# Patient Record
Sex: Male | Born: 1955 | Race: White | Hispanic: No | Marital: Single | Smoking: Current every day smoker
Health system: Southern US, Community
[De-identification: ages and names within clinical notes are randomized; demographics above are authoritative.]

## PROBLEM LIST (undated history)

## (undated) DIAGNOSIS — T7840XA Allergy, unspecified, initial encounter: Secondary | ICD-10-CM

## (undated) DIAGNOSIS — G4730 Sleep apnea, unspecified: Secondary | ICD-10-CM

## (undated) DIAGNOSIS — M1990 Unspecified osteoarthritis, unspecified site: Secondary | ICD-10-CM

## (undated) DIAGNOSIS — E785 Hyperlipidemia, unspecified: Secondary | ICD-10-CM

## (undated) DIAGNOSIS — R569 Unspecified convulsions: Secondary | ICD-10-CM

## (undated) DIAGNOSIS — N189 Chronic kidney disease, unspecified: Secondary | ICD-10-CM

## (undated) DIAGNOSIS — F209 Schizophrenia, unspecified: Secondary | ICD-10-CM

## (undated) DIAGNOSIS — I10 Essential (primary) hypertension: Secondary | ICD-10-CM

## (undated) HISTORY — DX: Unspecified convulsions: R56.9

## (undated) HISTORY — DX: Hyperlipidemia, unspecified: E78.5

## (undated) HISTORY — PX: TONSILLECTOMY: SUR1361

## (undated) HISTORY — DX: Essential (primary) hypertension: I10

## (undated) HISTORY — DX: Sleep apnea, unspecified: G47.30

## (undated) HISTORY — DX: Allergy, unspecified, initial encounter: T78.40XA

## (undated) HISTORY — DX: Schizophrenia, unspecified: F20.9

## (undated) HISTORY — PX: FACIAL FRACTURE SURGERY: SHX1570

## (undated) HISTORY — DX: Unspecified osteoarthritis, unspecified site: M19.90

## (undated) HISTORY — PX: COLONOSCOPY: SHX174

## (undated) HISTORY — DX: Chronic kidney disease, unspecified: N18.9

---

## 2003-06-21 ENCOUNTER — Ambulatory Visit (HOSPITAL_BASED_OUTPATIENT_CLINIC_OR_DEPARTMENT_OTHER): Admission: RE | Admit: 2003-06-21 | Discharge: 2003-06-21 | Payer: Self-pay | Admitting: Family Medicine

## 2003-06-21 ENCOUNTER — Encounter: Payer: Self-pay | Admitting: Pulmonary Disease

## 2004-02-14 ENCOUNTER — Ambulatory Visit: Payer: Self-pay | Admitting: Pulmonary Disease

## 2004-10-06 ENCOUNTER — Ambulatory Visit: Payer: Self-pay | Admitting: Pulmonary Disease

## 2005-08-16 ENCOUNTER — Ambulatory Visit: Payer: Self-pay | Admitting: Pulmonary Disease

## 2005-11-03 ENCOUNTER — Ambulatory Visit: Payer: Self-pay | Admitting: Family Medicine

## 2005-12-02 ENCOUNTER — Ambulatory Visit: Payer: Self-pay | Admitting: Family Medicine

## 2006-01-21 ENCOUNTER — Encounter: Payer: Self-pay | Admitting: Family Medicine

## 2006-01-28 ENCOUNTER — Ambulatory Visit: Payer: Self-pay | Admitting: Family Medicine

## 2006-02-11 ENCOUNTER — Ambulatory Visit: Payer: Self-pay | Admitting: Family Medicine

## 2006-04-07 ENCOUNTER — Ambulatory Visit: Payer: Self-pay | Admitting: Pulmonary Disease

## 2006-06-21 DIAGNOSIS — F209 Schizophrenia, unspecified: Secondary | ICD-10-CM | POA: Insufficient documentation

## 2006-06-22 DIAGNOSIS — G2581 Restless legs syndrome: Secondary | ICD-10-CM | POA: Insufficient documentation

## 2006-06-22 DIAGNOSIS — J309 Allergic rhinitis, unspecified: Secondary | ICD-10-CM | POA: Insufficient documentation

## 2006-06-22 DIAGNOSIS — I10 Essential (primary) hypertension: Secondary | ICD-10-CM

## 2007-03-13 DIAGNOSIS — G4733 Obstructive sleep apnea (adult) (pediatric): Secondary | ICD-10-CM

## 2007-03-14 ENCOUNTER — Ambulatory Visit: Payer: Self-pay | Admitting: Pulmonary Disease

## 2007-03-27 ENCOUNTER — Ambulatory Visit: Payer: Self-pay | Admitting: Family Medicine

## 2007-04-05 ENCOUNTER — Ambulatory Visit: Payer: Self-pay | Admitting: Family Medicine

## 2007-04-05 DIAGNOSIS — E7800 Pure hypercholesterolemia, unspecified: Secondary | ICD-10-CM

## 2007-04-07 ENCOUNTER — Ambulatory Visit: Payer: Self-pay | Admitting: Gastroenterology

## 2007-04-12 ENCOUNTER — Encounter (INDEPENDENT_AMBULATORY_CARE_PROVIDER_SITE_OTHER): Payer: Self-pay | Admitting: *Deleted

## 2007-04-12 LAB — CONVERTED CEMR LAB
AST: 19 units/L (ref 0–37)
Albumin: 4.2 g/dL (ref 3.5–5.2)
Alkaline Phosphatase: 82 units/L (ref 39–117)
CO2: 29 meq/L (ref 19–32)
Chloride: 98 meq/L (ref 96–112)
Cholesterol: 211 mg/dL (ref 0–200)
Creatinine, Ser: 1 mg/dL (ref 0.4–1.5)
PSA: 0.57 ng/mL (ref 0.10–4.00)
Sodium: 135 meq/L (ref 135–145)
Total Bilirubin: 0.8 mg/dL (ref 0.3–1.2)
Total Protein: 7.1 g/dL (ref 6.0–8.3)
VLDL: 15 mg/dL (ref 0–40)

## 2007-04-21 ENCOUNTER — Encounter: Payer: Self-pay | Admitting: Gastroenterology

## 2007-04-21 ENCOUNTER — Encounter: Payer: Self-pay | Admitting: Family Medicine

## 2007-04-21 ENCOUNTER — Ambulatory Visit: Payer: Self-pay | Admitting: Gastroenterology

## 2007-05-03 ENCOUNTER — Ambulatory Visit: Payer: Self-pay | Admitting: Family Medicine

## 2007-05-03 DIAGNOSIS — R21 Rash and other nonspecific skin eruption: Secondary | ICD-10-CM

## 2007-07-12 ENCOUNTER — Ambulatory Visit: Payer: Self-pay | Admitting: Family Medicine

## 2007-07-20 LAB — CONVERTED CEMR LAB
HDL: 37.6 mg/dL — ABNORMAL LOW (ref >39.0)
Total CHOL/HDL Ratio: 3.2
VLDL: 11 mg/dL (ref 0–40)

## 2008-03-13 ENCOUNTER — Ambulatory Visit: Payer: Self-pay | Admitting: Pulmonary Disease

## 2008-05-14 ENCOUNTER — Ambulatory Visit: Payer: Self-pay | Admitting: Family Medicine

## 2008-05-14 DIAGNOSIS — F17200 Nicotine dependence, unspecified, uncomplicated: Secondary | ICD-10-CM

## 2008-05-14 DIAGNOSIS — Z720 Tobacco use: Secondary | ICD-10-CM | POA: Insufficient documentation

## 2008-08-29 ENCOUNTER — Ambulatory Visit: Payer: Self-pay | Admitting: Family Medicine

## 2008-08-29 DIAGNOSIS — R634 Abnormal weight loss: Secondary | ICD-10-CM | POA: Insufficient documentation

## 2009-04-09 ENCOUNTER — Ambulatory Visit: Payer: Self-pay | Admitting: Pulmonary Disease

## 2009-05-12 ENCOUNTER — Telehealth: Payer: Self-pay | Admitting: Pulmonary Disease

## 2009-05-27 ENCOUNTER — Ambulatory Visit: Payer: Self-pay | Admitting: Family Medicine

## 2009-06-11 ENCOUNTER — Ambulatory Visit: Payer: Self-pay | Admitting: Family Medicine

## 2009-06-12 LAB — CONVERTED CEMR LAB
ALT: 21 units/L (ref 0–53)
Albumin: 4.2 g/dL (ref 3.5–5.2)
BUN: 5 mg/dL — ABNORMAL LOW (ref 6–23)
CO2: 31 meq/L (ref 19–32)
Chloride: 101 meq/L (ref 96–112)
Cholesterol: 126 mg/dL (ref 0–200)
Creatinine, Ser: 0.9 mg/dL (ref 0.4–1.5)
Direct LDL: 72.7 mg/dL
Eosinophils Relative: 5.4 % — ABNORMAL HIGH (ref 0.0–5.0)
Lymphocytes Relative: 31.1 % (ref 12.0–46.0)
Monocytes Absolute: 0.6 10*3/uL (ref 0.1–1.0)
Monocytes Relative: 9.7 % (ref 3.0–12.0)
Neutrophils Relative %: 52.5 % (ref 43.0–77.0)
Platelets: 223 10*3/uL (ref 150.0–400.0)
TSH: 1.99 microintl units/mL (ref 0.35–5.50)
Total Protein: 7 g/dL (ref 6.0–8.3)
WBC: 6.1 10*3/uL (ref 4.5–10.5)

## 2009-06-17 ENCOUNTER — Ambulatory Visit: Payer: Self-pay | Admitting: Family Medicine

## 2009-06-17 LAB — CONVERTED CEMR LAB: PSA: 0.62 ng/mL (ref 0.10–4.00)

## 2009-07-14 ENCOUNTER — Telehealth (INDEPENDENT_AMBULATORY_CARE_PROVIDER_SITE_OTHER): Payer: Self-pay | Admitting: *Deleted

## 2009-07-22 ENCOUNTER — Telehealth: Payer: Self-pay | Admitting: Gastroenterology

## 2009-07-22 ENCOUNTER — Encounter (INDEPENDENT_AMBULATORY_CARE_PROVIDER_SITE_OTHER): Payer: Self-pay | Admitting: *Deleted

## 2009-07-28 ENCOUNTER — Telehealth (INDEPENDENT_AMBULATORY_CARE_PROVIDER_SITE_OTHER): Payer: Self-pay | Admitting: *Deleted

## 2009-08-13 ENCOUNTER — Telehealth (INDEPENDENT_AMBULATORY_CARE_PROVIDER_SITE_OTHER): Payer: Self-pay | Admitting: *Deleted

## 2009-08-26 ENCOUNTER — Encounter (INDEPENDENT_AMBULATORY_CARE_PROVIDER_SITE_OTHER): Payer: Self-pay | Admitting: *Deleted

## 2009-08-29 ENCOUNTER — Ambulatory Visit: Payer: Self-pay | Admitting: Gastroenterology

## 2009-09-02 ENCOUNTER — Ambulatory Visit: Payer: Self-pay | Admitting: Gastroenterology

## 2009-09-03 ENCOUNTER — Encounter: Payer: Self-pay | Admitting: Gastroenterology

## 2009-09-12 ENCOUNTER — Ambulatory Visit: Payer: Self-pay | Admitting: Family Medicine

## 2009-09-12 LAB — CONVERTED CEMR LAB
HDL: 42.2 mg/dL (ref >39.00)
Triglycerides: 174 mg/dL — ABNORMAL HIGH (ref 0.0–149.0)

## 2009-09-17 ENCOUNTER — Ambulatory Visit: Payer: Self-pay | Admitting: Family Medicine

## 2009-09-17 DIAGNOSIS — J018 Other acute sinusitis: Secondary | ICD-10-CM

## 2009-12-24 ENCOUNTER — Ambulatory Visit: Payer: Self-pay | Admitting: Family Medicine

## 2010-04-08 ENCOUNTER — Ambulatory Visit
Admission: RE | Admit: 2010-04-08 | Discharge: 2010-04-08 | Payer: Self-pay | Source: Home / Self Care | Attending: Pulmonary Disease | Admitting: Pulmonary Disease

## 2010-04-30 NOTE — Assessment & Plan Note (Signed)
Summary: COMPLAINING OF HIGH BLOOD PRESSURE   Vital Signs:  Patient profile:   55 year old male Height:      68 inches Weight:      221.6 pounds BMI:     33.82 Temp:     98.1 degrees F oral Pulse rate:   72 / minute Pulse rhythm:   regular BP sitting:   140 / 80  (left arm) Cuff size:   regular  Vitals Entered By: Benny Lennert CMA Duncan Dull) (May 27, 2009 4:06 PM)  History of Present Illness: Chief complaint htn   HTN, poor control. Elevated at dentist recently.  Not checking BP at home. 6 lb weight loss in past few months. Limited exercsie. Still smoking..1/2 pack a day..did not tolerate Chantix.  Preventive Screening-Counseling & Management  Alcohol-Tobacco     Smoking Cessation Counseling: YES     Smoke Cessation Stage: precontemplative     Packs/Day: 0.5     Tobacco Counseling: to quit use of tobacco products  Problems Prior to Update: 1)  Weight Loss  (ICD-783.21) 2)  Tobacco Abuse  (ICD-305.1) 3)  Restless Legs Syndrome  (ICD-333.94) 4)  Skin Rash  (ICD-782.1) 5)  Pure Hypercholesterolemia  (ICD-272.0) 6)  Special Screening Malignant Neoplasm of Prostate  (ICD-V76.44) 7)  Preventive Health Care  (ICD-V70.0) 8)  Obstructive Sleep Apnea  (ICD-327.23) 9)  Hypertension  (ICD-401.9) 10)  Allergic Rhinitis  (ICD-477.9) 11)  Hx of Restless Leg Syndrome  (ICD-333.94) 12)  Schizophrenia  (ICD-295.90)  Current Medications (verified): 1)  Requip 1 Mg Tabs (Ropinirole Hcl) .... Take 1 Tablet By Mouth At Bedtime 2)  Claritin 10 Mg Tabs (Loratadine) .... Take 1 Tablet By Mouth Once A Day 3)  Artane 5mg  .... Take 1 Tablet By Mouth Two Times A Day 4)  Prolixin 10mg  .... Take 1 Tablet By Mouth Once A Day 5)  Lisinopril-Hydrochlorothiazide 10-12.5 Mg Tabs (Lisinopril-Hydrochlorothiazide) .Marland Kitchen.. 1 Tab By Mouth Daily 6)  Simvastatin 40 Mg  Tabs (Simvastatin) .... Take 1 Tablet By Mouth Once A Day  Allergies: 1)  ! Chantix Continuing Month Pak (Varenicline Tartrate)  Past  History:  Past medical, surgical, family and social histories (including risk factors) reviewed, and no changes noted (except as noted below).  Past Medical History: Reviewed history from 06/21/2006 and no changes required. Allergic rhinitis Hypertension  Past Surgical History: Reviewed history from 06/21/2006 and no changes required. Tonsillectomy  Family History: Reviewed history from 03/27/2007 and no changes required. mother healthy father CVA, died of gallbladder disease and complications  Social History: Reviewed history from 03/27/2007 and no changes required. 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 CV:  Denies chest pain or discomfort and swelling of feet. Resp:  Denies shortness of breath, sputum productive, and wheezing. GI:  Denies abdominal pain. GU:  Denies dysuria.  Physical Exam  General:  overweight  male in nad Mouth:  Oral mucosa and oropharynx without lesions or exudates.  Teeth in poor repair...smells of smoke Neck:  no carotid bruit or thyromegaly no cervical or supraclavicular lymphadenopathy  Lungs:  Normal respiratory effort, chest expands symmetrically. Lungs are clear to auscultation, no crackles or wheezes. Heart:  Normal rate and regular rhythm. S1 and S2 normal without gallop, murmur, click, rub or other extra sounds. Abdomen:  Bowel sounds positive,abdomen soft and non-tender without masses, organomegaly or hernias noted. Pulses:  R and L posterior tibial pulses are full and  equal bilaterally  Extremities:  no edema    Impression & Recommendations:  Problem # 1:  HYPERTENSION (ICD-401.9) Poor control. Start lisinopril/HCTZ daily, check Cr in 7-10 days. Counseled on smoking cessation.Teryl Lucy precontemplative (did not tolerate chantix in past) His updated medication list for this problem includes:    Lisinopril-hydrochlorothiazide  10-12.5 Mg Tabs (Lisinopril-hydrochlorothiazide) .Marland Kitchen... 1 tab by mouth daily  Complete Medication List: 1)  Requip 1 Mg Tabs (Ropinirole hcl) .... Take 1 tablet by mouth at bedtime 2)  Claritin 10 Mg Tabs (Loratadine) .... Take 1 tablet by mouth once a day 3)  Artane 5mg   .... Take 1 tablet by mouth two times a day 4)  Prolixin 10mg   .... Take 1 tablet by mouth once a day 5)  Lisinopril-hydrochlorothiazide 10-12.5 Mg Tabs (Lisinopril-hydrochlorothiazide) .Marland Kitchen.. 1 tab by mouth daily 6)  Simvastatin 40 Mg Tabs (Simvastatin) .... Take 1 tablet by mouth once a day  Patient Instructions: 1)  Fasting lipids, CMET, TSH, cbc Dx 401.1 prior to next appt.  2)  Scheduled CPX in next  2-3 weeks.  3)  Start lisinopril/HCTZ daily  4)  Tobacco is very bad for your health and your loved ones ! You should stop smoking !  5)  Stop smoking tips: Choose a quit date. Cut down before the quit date. Decide what you will do as a substitute when you feel the urge to smoke(gum, toothpick, exercise).  Prescriptions: LISINOPRIL-HYDROCHLOROTHIAZIDE 10-12.5 MG TABS (LISINOPRIL-HYDROCHLOROTHIAZIDE) 1 tab by mouth daily  #30 x 11   Entered and Authorized by:   Kerby Nora MD   Signed by:   Kerby Nora MD on 05/27/2009   Method used:   [facility]        [facility]  [address] (retail)       [address]       [place], [place]  [address]       Ph: [phone] or [phone]       Fax: [phone]   RxID:   [id number]   Current Allergies (reviewed today): ! CHANTIX CONTINUING MONTH PAK (VARENICLINE TARTRATE)

## 2010-04-30 NOTE — Letter (Signed)
Summary: Previsit letter  [facility]  [address] [place], [place] [address]   Phone: [phone]  Fax: [phone]       07/22/2009 MRN: [id number]  Carl Contreras [address] [place]  [address]  Dear Carl Contreras,  Welcome to the Gastroenterology Division at [facility].    You are scheduled to see a nurse for your pre-procedure visit on 08/29/09 at 1:00pm on the 3rd floor at [facility], [address]  We ask that you try to arrive at our office 15 minutes prior to your appointment time to allow for check-in.  Your nurse visit will consist of discussing your medical and surgical history, your immediate family medical history, and your medications.    Please bring a complete list of all your medications or, if you prefer, bring the medication bottles and we will list them.  We will need to be aware of both prescribed and over the counter drugs.  We will need to know exact dosage information as well.  If you are on blood thinners (Coumadin, Plavix, Aggrenox, Ticlid, etc.) please call our office today/prior to your appointment, as we need to consult with your physician about holding your medication.   Please be prepared to read and sign documents such as consent forms, a financial agreement, and acknowledgement forms.  If necessary, and with your consent, a friend or relative is welcome to sit-in on the nurse visit with you.  Please bring your insurance card so that we may make a copy of it.  If your insurance requires a referral to see a specialist, please bring your referral form from your primary care physician.  No co-pay is required for this nurse visit.     If you cannot keep your appointment, please call [phone] to cancel or reschedule prior to your appointment date.  This allows [place] the opportunity to schedule an appointment for another patient in need of care.    Thank you for choosing [facility] for your  medical needs.  We appreciate the opportunity to care for you.  Please visit [place] at our website  to learn more about our practice.                     Sincerely.                                                                                                                   The Gastroenterology Division

## 2010-04-30 NOTE — Assessment & Plan Note (Signed)
Summary: BAD HEAD COLD OR SEVERE SINUS INFECTION   Vital Signs:  Patient profile:   55 year old male Height:      68 inches Weight:      225 pounds BMI:     34.33 Temp:     98.8 degrees F oral Pulse rate:   76 / minute Pulse rhythm:   regular BP sitting:   138 / 80  (left arm) Cuff size:   regular  Vitals Entered By: Linde Gillis CMA Duncan Dull) (September 17, 2009 3:11 PM) CC: ? sinus infection/cold   History of Present Illness: 55 yo here for over 1 week of URI symptoms.  Started with runny nose and sneezing. Over last 3 days, having sinus pressure, frontal headache, feels like he has a fever. No cough, sore throat, shortness of breath or wheezing.   Current Medications (verified): 1)  Requip 1 Mg Tabs (Ropinirole Hcl) .... Take 1 Tablet By Mouth At Bedtime 2)  Claritin 10 Mg Tabs (Loratadine) .... Take 1 Tablet By Mouth Once A Day 3)  Artane 5mg  .... Take 1 Tablet By Mouth Two Times A Day 4)  Prolixin 10mg  .... Take 1 Tablet By Mouth Once A Day 5)  Lisinopril-Hydrochlorothiazide 10-12.5 Mg Tabs (Lisinopril-Hydrochlorothiazide) .Marland Kitchen.. 1 Tab By Mouth Daily 6)  Simvastatin 40 Mg  Tabs (Simvastatin) .... Take 1 Tablet By Mouth Once A Day 7)  Fish Oil 1000 Mg Caps (Omega-3 Fatty Acids) .... Take 2 Tablet 2 Times Daily 8)  Amoxicillin 500 Mg Tabs (Amoxicillin) .Marland Kitchen.. 1 Tab By Mouth Two Times A Day X 10 Days  Allergies: 1)  ! 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 ENT:  Complains of nasal congestion, postnasal drainage, and sinus pressure. CV:  Denies chest pain or discomfort.  Physical Exam  General:  Well-developed,well-nourished,in no acute distress; alert,appropriate and cooperative throughout examination Ears:  TMs retracted bilaterally Nose:  mucosal erythema, thick nasal discharge sinuses ttp throughout Mouth:  pharyngeal erythema.   no exudate Lungs:  Normal respiratory effort, chest expands symmetrically. Lungs are clear to auscultation, no crackles or wheezes. Heart:  Normal rate and regular rhythm. S1 and S2 normal without gallop, murmur, click, rub or other extra sounds. Psych:  Cognition and judgment appear intact. Alert and cooperative with normal attention span and concentration. No apparent delusions, illusions, hallucinations   Impression & Recommendations:  Problem # 1:  OTHER ACUTE SINUSITIS (ICD-461.8) Assessment New Given duration and progression of symptoms, will treat for bacterial sinusitis with amoxicillin x 10days. Supportive care as per pt instructions. His updated medication list for this problem includes:    Amoxicillin 500 Mg Tabs (Amoxicillin) .Marland Kitchen... 1 tab by mouth two times a day x 10 days  Complete Medication List: 1)  Requip 1 Mg Tabs (Ropinirole hcl) .... Take 1 tablet by mouth at bedtime 2)  Claritin 10 Mg Tabs (Loratadine) .... Take 1 tablet by mouth once a day 3)  Artane 5mg   .... Take 1  tablet by mouth two times a day 4)  Prolixin 10mg   .... Take 1 tablet by mouth once a day 5)  Lisinopril-hydrochlorothiazide 10-12.5 Mg Tabs (Lisinopril-hydrochlorothiazide) .Marland Kitchen.. 1 tab by mouth daily 6)  Simvastatin 40 Mg Tabs (Simvastatin) .... Take 1 tablet by mouth once a day 7)  Fish Oil 1000 Mg Caps (Omega-3 fatty acids) .... Take 2 tablet 2 times daily 8)  Amoxicillin 500  Mg Tabs (Amoxicillin) .Marland Kitchen.. 1 tab by mouth two times a day x 10 days  Patient Instructions: 1)  Take antibiotic as directed.  Drink lots of fluids.  Treat sympotmatically with Mucinex, nasal saline irrigation, and Tylenol/Ibuprofen. Also try claritin D or zyrtec D over the counter- two times a day as needed ( have to sign for them at pharmacy). You can use warm compresses.  Cough suppressant at night. Call if not improving as expected in 5-7 days.  Prescriptions: AMOXICILLIN 500 MG TABS (AMOXICILLIN) 1 tab by mouth two times a day x 10 days  #20 x 0   Entered and Authorized by:   Ruthe Mannan MD   Signed by:   Ruthe Mannan MD on 09/17/2009   Method used:   Print then Give to Patient   RxID:   [id number]   Current Allergies (reviewed today): ! CHANTIX CONTINUING MONTH PAK (VARENICLINE TARTRATE)

## 2010-04-30 NOTE — Miscellaneous (Signed)
Summary: Health Care Power of [facility] Power of Attorney   Imported By: Lenard Forth 03/20/2010 13:34:19  _____________________________________________________________________  External Attachment:    Type:   Image     Comment:   External Document

## 2010-04-30 NOTE — Progress Notes (Signed)
Summary: requip refill  Phone Note Call from Patient Call back at Home Phone [phone]   Caller: Patient Call For: clance Reason for Call: Refill Medication Summary of Call: Patient requesting refill--requip 1 mg---[facility] Initial call taken by: Lehman Prom,  Aug 13, 2009 12:16 PM  Follow-up for Phone Call        Rx was sent to [facility] per pt request.  [facility] for pt to be made aware. Follow-up by: Vernie Murders,  Aug 13, 2009 1:42 PM    Prescriptions: REQUIP 1 MG TABS (ROPINIROLE HCL) Take 1 tablet by mouth at bedtime  #90 x 3   Entered by:   Vernie Murders   Authorized by:   Barbaraann Share MD   Signed by:   Vernie Murders on 08/13/2009   Method used:   Electronically to        [facility] (mail-order)             ,          Ph: [phone]       Fax: [phone]   RxID:   [id number]

## 2010-04-30 NOTE — Assessment & Plan Note (Signed)
Summary: rov for osa/rls   CC:  Pt is here for a 1 yr f/u appt.  pt was last seen Dec 2009.  Pt requesting refill on Requip.  Pt states he is wearing his cpap machine every night.  Approx 7 hours per night.  Pt denied any complaints with mask or pressure.  Marland Kitchen  History of Present Illness: the pt comes in today for f/u of his known osa and RLS.  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  He has no issues with pressure or mask fit, but is due for a mask replacement.  He feels that he is sleeping well, and reports no significant daytime sleepiness issues.  Current Medications (verified): 1)  Requip 1 Mg Tabs (Ropinirole Hcl) .... Take 1 Tablet By Mouth At Bedtime 2)  Claritin 10 Mg Tabs (Loratadine) .... Take 1 Tablet By Mouth Once A Day 3)  Artane 5mg  .... Take 1 Tablet By Mouth Two Times A Day 4)  Prolixin 10mg  .... Take 1 Tablet By Mouth Once A Day 5)  Hydrochlorothiazide 25 Mg  Tabs (Hydrochlorothiazide) .... Take 1 Tablet By Mouth Once A Day 6)  Simvastatin 40 Mg  Tabs (Simvastatin) .... Take 1 Tablet By Mouth Once A Day  Allergies (verified): 1)  ! Chantix Continuing Month Pak (Varenicline Tartrate)  Review of Systems      See HPI  Vital Signs:  Patient profile:   55 year old male Height:      68 inches Weight:      227.25 pounds BMI:     34.68 O2 Sat:      95 % on Room air Temp:     98.1 degrees F oral Pulse rate:   68 / minute BP sitting:   114 / 70  (left arm) Cuff size:   regular  Vitals Entered By: Arman Filter LPN (April 09, 2009 1:42 PM)  O2 Flow:  Room air CC: Pt is here for a 1 yr f/u appt.  pt was last seen Dec 2009.  Pt requesting refill on Requip.  Pt states he is wearing his cpap machine every night.  Approx 7 hours per night.  Pt denied any complaints with mask or pressure.   Comments Medications reviewed with patient Arman Filter LPN  April 09, 2009 1:43 PM    Physical  Exam  General:  ow male in nad Nose:  no skin breakdown or pressure necrosis from cpap mask Neurologic:  alert, not sleepy, moves all 4.   Impression & Recommendations:  Problem # 1:  RESTLESS LEGS SYNDROME (ICD-333.94) controlled on requip  Problem # 2:  OBSTRUCTIVE SLEEP APNEA (ICD-327.23) he is wearing cpap compliantly, and is having no issues with breakthru events or symptoms.  Other Orders: DME Referral (DME) Est. Patient Level II (40981)  Patient Instructions: 1)  Please schedule a follow-up appointment in 1 year. 2)  work on weight loss   Prescriptions: REQUIP 1 MG TABS (ROPINIROLE HCL) Take 1 tablet by mouth at bedtime  #90 Tablet x 4   Entered and Authorized by:   Barbaraann Share MD   Signed by:   Barbaraann Share MD on 04/09/2009   Method used:   Print then Give to Patient   RxID:   [id number]

## 2010-04-30 NOTE — Procedures (Signed)
Summary: Colonoscopy  Patient: Carl Contreras Note: All result statuses are Final unless otherwise noted.  Tests: (1) Colonoscopy (COL)   COL Colonoscopy           DONE     [facility]     [address]     [place], [place]  [address]           COLONOSCOPY PROCEDURE REPORT           PATIENT:  Carl Contreras, Carl Contreras  MR#:  [id number]     BIRTHDATE:  [birth year], 54 yrs. old  GENDER:  male     ENDOSCOPIST:  Judie Petit T. Russella Dar, MD, [facility]           PROCEDURE DATE:  09/02/2009     PROCEDURE:  Colonoscopy with hot biopsy, Colonoscopy with snare     polypectomy     ASA CLASS:  Class II     INDICATIONS:  1) follow-up of polyp  2) surveillance and high-risk     screening, adenomatous polyps, piecemeal polypectomies 03/2007.     MEDICATIONS:   Fentanyl 75 mcg IV, Versed 7 mg IV     DESCRIPTION OF PROCEDURE:   After the risks benefits and     alternatives of the procedure were thoroughly explained, informed     consent was obtained.  Digital rectal exam was performed and     revealed no abnormalities.   The LB [id number]-[id number] endoscope     was introduced through the anus and advanced to the cecum, which     was identified by both the appendix and ileocecal valve, without     limitations.  The quality of the prep was excellent, using     MoviPrep.  The instrument was then slowly withdrawn as the colon     was fully examined.     <<PROCEDUREIMAGES>>     FINDINGS:  Two polyps were found in the descending colon. They     were 5 mm in size. Polyps were snared without cautery. Retrieval     was successful. Seven polyps were found in the sigmoid colon. They     were 3 - 4 mm in size. With hot biopsy forceps, the polyps were     cauterized, biopsies were obtained and sent to pathology.  A     sessile polyp was found in the rectum. It was 3 mm in size. With     hot biopsy forceps, the polyp was cauterized, biopsy was obtained     and sent to pathology.  This was otherwise a normal  examination of     the colon.   Retroflexed views in the rectum revealed no     abnormalities. The time to cecum =  2  minutes. The scope was then     withdrawn (time =  14.75  min) from the patient and the procedure     completed.           COMPLICATIONS:  None           ENDOSCOPIC IMPRESSION:     1) 5 mm Two polyps in the descending colon     2) 3 - 4 mm Seven polyps in the sigmoid colon     3) 3 mm sessile polyp in the rectum     RECOMMENDATIONS:     1) No aspirin or NSAID's for 2 weeks     2) Await pathology results     3) Repeat Colonoscopy in 3 years  pending pathology review.           Venita Lick. Russella Dar, MD, [facility]           CC: Amy Michelle Nasuti, MD           n.     Rosalie DoctorVenita Lick. Yecenia Dalgleish at 09/02/2009 10:46 AM           Teryl Lucy, [id number]  Note: An exclamation mark (!) indicates a result that was not dispersed into the flowsheet. Document Creation Date: 09/02/2009 10:47 AM _______________________________________________________________________  (1) Order result status: Final Collection or observation date-time: 09/02/2009 10:39 Requested date-time:  Receipt date-time:  Reported date-time:  Referring Physician:   Ordering Physician: Claudette Head [phone]) Specimen Source:  Source: Launa Grill Order Number: [id number] Lab site:   Appended Document: Colonoscopy     Procedures Next Due Date:    Colonoscopy: 08/2012

## 2010-04-30 NOTE — Letter (Signed)
Summary: Patient Notice-Hyperplastic Polyps  [facility]  [address] [place], [place] [address]   Phone: [phone]  Fax: [phone]        September 03, 2009 MRN: [id number]    DARIL WARGA [address] [place], [place]  [address]    Dear Mr. LENZEN,  I am pleased to inform you that the colon polyp(s) removed during your recent colonoscopy was (were) found to be hyperplastic. These types of polyps are NOT pre-cancerous.  It is my recommendation that you have a repeat colonoscopy examination in 3 years.  Should you develop new or worsening symptoms of abdominal pain, bowel habit changes or bleeding from the rectum or bowels, please schedule an evaluation with either your primary care physician or with me.  Continue treatment plan as outlined the day of your exam.  Please call us if you are having persistent problems or have questions about your condition that have not been fully answered at this time.  Sincerely,  Meryl Dare MD [facility]  This letter has been electronically signed by your physician.  Appended Document: Patient Notice-Hyperplastic Polyps letter mailed.

## 2010-04-30 NOTE — Progress Notes (Signed)
Summary: cpap  Phone Note Call from Patient   Caller: [email or web address]  Call For: clance Summary of Call: pt needs new cpap replacement need order thanks Initial call taken by: Oneita Jolly,  May 12, 2009 11:30 AM  Follow-up for Phone Call        we need to know how old is his current machine, and is he eligible for replacement. Follow-up by: Barbaraann Share MD,  May 12, 2009 5:11 PM  Additional Follow-up for Phone Call Additional follow up Details #1::        last cpap was purchased in 2005 needs new cpap  Additional Follow-up by: Oneita Jolly,  May 13, 2009 10:15 AM    Additional Follow-up for Phone Call Additional follow up Details #2::    if insurance will cover, ok to phone in order. Follow-up by: Barbaraann Share MD,  May 14, 2009 3:43 PM  Additional Follow-up for Phone Call Additional follow up Details #3:: Details for Additional Follow-up Action Taken: ok will you put me an order in pcc box It was already done before you even sent response!! Additional Follow-up by: Oneita Jolly,  May 14, 2009 3:45 PM

## 2010-04-30 NOTE — Assessment & Plan Note (Signed)
Summary: CPX/DLO   Vital Signs:  Patient profile:   55 year old male Height:      68 inches Weight:      226.4 pounds BMI:     34.55 Temp:     97.9 degrees F oral Pulse rate:   72 / minute Pulse rhythm:   regular BP sitting:   112 / 70  (left arm) Cuff size:   regular  Vitals Entered By: Benny Lennert CMA Duncan Dull) (June 17, 2009 9:05 AM)  History of Present Illness: Chief complaint cpx  The patient is here for annual wellness exam and preventative care.    I feel better on bloodpressure medicaiton.   Hypertension History:      He denies headache, chest pain, palpitations, dyspnea with exertion, peripheral edema, visual symptoms, and syncope.  Much improved on lisinopril/HCTZ.        Positive major cardiovascular risk factors include male age 67 years old or older, hyperlipidemia, hypertension, and current tobacco user.     Problems Prior to Update: 1)  Weight Loss  (ICD-783.21) 2)  Tobacco Abuse  (ICD-305.1) 3)  Restless Legs Syndrome  (ICD-333.94) 4)  Skin Rash  (ICD-782.1) 5)  Pure Hypercholesterolemia  (ICD-272.0) 6)  Special Screening Malignant Neoplasm of Prostate  (ICD-V76.44) 7)  Preventive Health Care  (ICD-V70.0) 8)  Obstructive Sleep Apnea  (ICD-327.23) 9)  Hypertension  (ICD-401.9) 10)  Allergic Rhinitis  (ICD-477.9) 11)  Hx of Restless Leg Syndrome  (ICD-333.94) 12)  Schizophrenia  (ICD-295.90)  Current Medications (verified): 1)  Requip 1 Mg Tabs (Ropinirole Hcl) .... Take 1 Tablet By Mouth At Bedtime 2)  Claritin 10 Mg Tabs (Loratadine) .... Take 1 Tablet By Mouth Once A Day 3)  Artane 5mg  .... Take 1 Tablet By Mouth Two Times A Day 4)  Prolixin 10mg  .... Take 1 Tablet By Mouth Once A Day 5)  Lisinopril-Hydrochlorothiazide 10-12.5 Mg Tabs (Lisinopril-Hydrochlorothiazide) .Marland Kitchen.. 1 Tab By Mouth Daily 6)  Simvastatin 40 Mg  Tabs (Simvastatin) .... Take 1 Tablet By Mouth Once A Day 7)  Fish Oil 1000 Mg Caps (Omega-3 Fatty Acids) .... Take 2 Tablet 2 Times  Daily  Allergies: 1)  ! Chantix Continuing Month Pak (Varenicline Tartrate)  Past History:  Past medical, surgical, family and social histories (including risk factors) reviewed, and no changes noted (except as noted below).  Past Medical History: Reviewed history from 06/21/2006 and no changes required. Allergic rhinitis Hypertension  Past Surgical History: Reviewed history from 06/21/2006 and no changes required. Tonsillectomy  Family History: Reviewed history from 03/27/2007 and no changes required. mother healthy father CVA, died of gallbladder disease and complications  Social History: Reviewed history from 03/27/2007 and no changes required. 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 CV:  Denies chest pain or discomfort. Resp:  Denies shortness of breath. GI:  Denies abdominal pain. GU:  Denies dysuria.  Physical Exam  General:  Well-developed,well-nourished,in no acute distress; alert,appropriate and cooperative throughout examination Head:  Normocephalic and atraumatic without obvious abnormalities. No apparent alopecia or balding. Eyes:  No corneal or conjunctival inflammation noted. EOMI. Perrla. Funduscopic exam benign, without hemorrhages, exudates or papilledema. Vision grossly normal. Ears:  External ear exam shows no significant lesions or deformities.  Otoscopic examination reveals clear canals, tympanic membranes are intact bilaterally without bulging, retraction, inflammation or discharge. Hearing is grossly normal bilaterally. Nose:  External nasal examination shows no deformity or inflammation. Nasal mucosa are pink and  moist without lesions or exudates. Mouth:  Oral mucosa and oropharynx without lesions or exudates.  Teeth in good repair. Neck:  No deformities, masses, or tenderness noted. Lungs:  Normal respiratory effort, chest expands symmetrically. Lungs are clear to  auscultation, no crackles or wheezes. Heart:  Normal rate and regular rhythm. S1 and S2 normal without gallop, murmur, click, rub or other extra sounds. Abdomen:  Bowel sounds positive,abdomen soft and non-tender without masses, organomegaly or hernias noted. Rectal:  No external abnormalities noted. Normal sphincter tone. No rectal masses or tenderness. Genitalia:  Testes bilaterally descended without nodularity, tenderness or masses. No scrotal masses or lesions. No penis lesions or urethral discharge. Prostate:  Prostate gland firm and smooth, no enlargement, nodularity, tenderness, mass, asymmetry or induration. Msk:  No deformity or scoliosis noted of thoracic or lumbar spine.   Pulses:  R and L carotid,radial,femoral,dorsalis pedis and posterior tibial pulses are full and equal bilaterally Extremities:  No clubbing, cyanosis, edema, or deformity noted with normal full range of motion of all joints.   Skin:  Intact without suspicious lesions or rashes Psych:  Cognition and judgment appear intact. Alert and cooperative with normal attention span and concentration. No apparent delusions, illusions, hallucinations   Impression & Recommendations:  Problem # 1:  Preventive Health Care (ICD-V70.0) The patient's preventative maintenance and recommended screening tests for an annual wellness exam were reviewed in full today. Brought up to date unless services declined.  Counselled on the importance of diet, exercise, and its role in overall health and mortality. The patient's FH and SH was reviewed, including their home life, tobacco status, and drug and alcohol status.     Problem # 2:  HYPERTENSION (ICD-401.9) Well controlled. Continue current medication.  His updated medication list for this problem includes:    Lisinopril-hydrochlorothiazide 10-12.5 Mg Tabs (Lisinopril-hydrochlorothiazide) .Marland Kitchen... 1 tab by mouth daily  BP today: 112/70 Prior BP: 140/80 (05/27/2009)  10 Yr Risk Heart  Disease: 7 % Prior 10 Yr Risk Heart Disease: Not enough information (03/27/2007)  Labs Reviewed: K+: 4.6 (06/11/2009) Creat: : 0.9 (06/11/2009)   Chol: 126 (06/11/2009)   HDL: 43.20 (06/11/2009)   LDL: 72 (07/12/2007)   TG: 270.0 (06/11/2009)  Problem # 3:  TOBACCO ABUSE (ICD-305.1)  Encouraged smoking cessation and discussed different methods for smoking cessation.   Complete Medication List: 1)  Requip 1 Mg Tabs (Ropinirole hcl) .... Take 1 tablet by mouth at bedtime 2)  Claritin 10 Mg Tabs (Loratadine) .... Take 1 tablet by mouth once a day 3)  Artane 5mg   .... Take 1 tablet by mouth two times a day 4)  Prolixin 10mg   .... Take 1 tablet by mouth once a day 5)  Lisinopril-hydrochlorothiazide 10-12.5 Mg Tabs (Lisinopril-hydrochlorothiazide) .Marland Kitchen.. 1 tab by mouth daily 6)  Simvastatin 40 Mg Tabs (Simvastatin) .... Take 1 tablet by mouth once a day 7)  Fish Oil 1000 Mg Caps (Omega-3 fatty acids) .... Take 2 tablet 2 times daily  Other Orders: Tdap => 35yrs IM (96045) Admin 1st Vaccine (40981)  Hypertension Assessment/Plan:      The patient's hypertensive risk group is category B: At least one risk factor (excluding diabetes) with no target organ damage.  His calculated 10 year risk of coronary heart disease is 7 %.  Today's blood pressure is 112/70.  His blood pressure goal is < 140/90.  Patient Instructions: 1)  Increase fish oil to 2 tab by mouth two times a day.   2)  Please schedule a follow-up  appointment in 6 months HTN  Current Allergies (reviewed today): ! CHANTIX CONTINUING MONTH PAK (VARENICLINE TARTRATE)   Immunizations Administered:  Tetanus Vaccine:    Vaccine Type: Tdap    Site: left deltoid    Mfr: GlaxoSmithKline    Dose: 0.5 ml    Route: IM    Given by: Benny Lennert CMA (AAMA)    Exp. Date: 05/24/2011    Lot #: [id number]    VIS given: 02/14/07 version given June 17, 2009.  Flu Vaccine Next Due:  Refused Last TD:  historical (03/29/1998 11:35:37  AM) TD Result Date:  06/17/2009 TD Result:  given TD Next Due:  10 yr Flex Sig Next Due:  Not Indicated Hemoccult Next Due:  Not Indicated     Appended Document: Orders Update     Clinical Lists Changes  Orders: Added new Service order of Venipuncture (62952) - Signed Added new Test order of TLB-PSA (Prostate Specific Antigen) (84153-PSA) - Signed

## 2010-04-30 NOTE — Progress Notes (Signed)
Summary: Schedule recall colonoscopy   Phone Note Outgoing Call Call back at Home Phone [phone]   Call placed by: Christie Nottingham CMA Duncan Dull),  July 22, 2009 10:43 AM Call placed to: Patient Summary of Call: Called pt to schedule recall colonoscopy and pt scheduled for 09/02/09 and previsit on 08/29/09. Letter mailed to pt with appt date and times. Initial call taken by: Christie Nottingham CMA Duncan Dull),  July 22, 2009 10:44 AM

## 2010-04-30 NOTE — Miscellaneous (Signed)
Summary: Living Will  Living Will   Imported By: Lenard Forth 03/20/2010 13:26:25  _____________________________________________________________________  External Attachment:    Type:   Image     Comment:   External Document

## 2010-04-30 NOTE — Progress Notes (Signed)
Summary: FYI  Phone Note Call from Patient   Caller: Mom Call For: clance Summary of Call: Any future meds need to go thru [facility] order pharmacy Initial call taken by: Eugene Gavia,  Jul 28, 2009 2:41 PM  Follow-up for Phone Call        Noted Follow-up by: Vernie Murders,  Jul 28, 2009 2:47 PM

## 2010-04-30 NOTE — Assessment & Plan Note (Signed)
Summary: rov for osa, rls   Visit Type:  Follow-up  CC:  1 year follow up. Pt states he wares everynight x7-8 hrs a night. pt states he is having no rpoblems with machine/mask. Pt states he still has some problems with his rls. .  History of Present Illness: the pt comes in today for f/u of his known osa and RLS.  He has been wearing cpap compliantly, and feels that he sleeps well with the device.  He denies any major sleepiness issues during the day, and has no issues with his mask fit.  He is taking requip for his RLS symptoms, and although has some breakthru at times, overall is satisfied with his control.  Current Medications (verified): 1)  Requip 1 Mg Tabs (Ropinirole Hcl) .... Take 1 Tablet By Mouth At Bedtime 2)  Claritin 10 Mg Tabs (Loratadine) .... Take 1 Tablet By Mouth Once A Day 3)  Artane 5mg  .... Take 1 Tablet By Mouth Two Times A Day 4)  Prolixin 5mg  and 2.5 Mg .... Patient Takes 7.5mg  At Bed Time 5)  Lisinopril-Hydrochlorothiazide 10-12.5 Mg Tabs (Lisinopril-Hydrochlorothiazide) .Marland Kitchen.. 1 Tab By Mouth Daily 6)  Simvastatin 40 Mg  Tabs (Simvastatin) .... Take 1 Tablet By Mouth Once A Day 7)  Fish Oil 1000 Mg Caps (Omega-3 Fatty Acids) .... Take 3 Tablet 2 Times Daily  Allergies (verified): 1)  ! Chantix Continuing Month Pak (Varenicline Tartrate)  Review of Systems       The patient complains of difficulty swallowing and nasal congestion/difficulty breathing through nose.  The patient denies shortness of breath with activity, shortness of breath at rest, productive cough, non-productive cough, coughing up blood, chest pain, irregular heartbeats, acid heartburn, indigestion, loss of appetite, weight change, abdominal pain, sore throat, tooth/dental problems, headaches, sneezing, itching, ear ache, anxiety, depression, hand/feet swelling, joint stiffness or pain, rash, change in color of mucus, and fever.    Vital Signs:  Patient profile:   55 year old male Height:      68  inches Weight:      237.25 pounds BMI:     36.20 O2 Sat:      97 % on Room air Temp:     97.9 degrees F oral Pulse rate:   79 / minute BP sitting:   110 / 68  (left arm) Cuff size:   large  Vitals Entered By: Carver Fila (April 08, 2010 1:32 PM)  O2 Flow:  Room air CC: 1 year follow up. Pt states he wares everynight x7-8 hrs a night. pt states he is having no rpoblems with machine/mask. Pt states he still has some problems with his rls.  Comments meds and allergies updated Phone number updated Mindy Silva  April 08, 2010 1:32 PM    Physical Exam  General:  ow male in nad Nose:  no skin breakdown or pressure necrosis from cpap mask Extremities:  no edema or cyanosis Neurologic:  alert, oriented, moves all 4 does not appear sleepy.   Impression & Recommendations:  Problem # 1:  OBSTRUCTIVE SLEEP APNEA (ICD-327.23)  the pt is doing well with cpap, and denies any compliance issues.  He feels he sleeps well, and has adequate daytime alertness.  I have asked him to continue with his device, and to work on weight loss.  Problem # 2:  RESTLESS LEGS SYNDROME (ICD-333.94) the pt feels his leg movements are adequately controlled on requip.  I have asked him to continue.  Medications Added to Medication List  This Visit: 1)  Fish Oil 1000 Mg Caps (Omega-3 fatty acids) .... Take 3 tablet 2 times daily  Other Orders: Est. Patient Level III (60454)  Patient Instructions: 1)  continue with cpap, and keep up with mask changes and supplies 2)  work on weight loss 3)  follow up with me in one year, or sooner if having issues.

## 2010-04-30 NOTE — Progress Notes (Signed)
Summary: refills  Phone Note Refill Request Call back at Home Phone [phone] Message from:  Patient on July 14, 2009 10:53 AM  Refills Requested: Medication #1:  LISINOPRIL-HYDROCHLOROTHIAZIDE 10-12.5 MG TABS 1 tab by mouth daily  Medication #2:  REQUIP 1 MG TABS Take 1 tablet by mouth at bedtime sent to [facility]  Initial call taken by: Melody Comas,  July 14, 2009 10:55 AM Caller: Mom Call For: Kerby Nora MD    New/Updated Medications: LISINOPRIL-HYDROCHLOROTHIAZIDE 10-12.5 MG TABS (LISINOPRIL-HYDROCHLOROTHIAZIDE) 1 tab by mouth daily Prescriptions: LISINOPRIL-HYDROCHLOROTHIAZIDE 10-12.5 MG TABS (LISINOPRIL-HYDROCHLOROTHIAZIDE) 1 tab by mouth daily  #30 x 6   Entered by:   Benny Lennert CMA (AAMA)   Authorized by:   Hannah Beat MD   Signed by:   Benny Lennert CMA (AAMA) on 07/14/2009   Method used:   Faxed to ...       [facility] (mail-order)             , [place]         Ph:        Fax: [phone]   RxID:   [id number]

## 2010-04-30 NOTE — Letter (Signed)
Summary: [facility] Instructions  [facility]  [address] [place], [place] [address]   Phone: [phone]  Fax: [phone]       Carl Contreras    [birth year]    MRN: [id number]        Procedure Day /Date:  Tuesday 09/02/2009     Arrival Time: 9:00 am      Procedure Time: 10:00 am     Location of Procedure:                    _x _  [facility] (4th Floor)                        PREPARATION FOR COLONOSCOPY WITH MOVIPREP   Starting 5 days prior to your procedure Thursday 6/2 do not eat nuts, seeds, popcorn, corn, beans, peas,  salads, or any raw vegetables.  Do not take any fiber supplements (e.g. Metamucil, Citrucel, and Benefiber).  THE DAY BEFORE YOUR PROCEDURE         DATE: Monday 6/6  1.  Drink clear liquids the entire day-NO SOLID FOOD  2.  Do not drink anything colored red or purple.  Avoid juices with pulp.  No orange juice.  3.  Drink at least 64 oz. (8 glasses) of fluid/clear liquids during the day to prevent dehydration and help the prep work efficiently.  CLEAR LIQUIDS INCLUDE: Water Jello Ice Popsicles Tea (sugar ok, no milk/cream) Powdered fruit flavored drinks Coffee (sugar ok, no milk/cream) Gatorade Juice: apple, white grape, white cranberry  Lemonade Clear bullion, consomm, broth Carbonated beverages (any kind) Strained chicken noodle soup Hard Candy                             4.  In the morning, mix first dose of MoviPrep solution:    Empty 1 Pouch A and 1 Pouch B into the disposable container    Add lukewarm drinking water to the top line of the container. Mix to dissolve    Refrigerate (mixed solution should be used within 24 hrs)  5.  Begin drinking the prep at 5:00 p.m. The MoviPrep container is divided by 4 marks.   Every 15 minutes drink the solution down to the next mark (approximately 8 oz) until the full liter is complete.   6.  Follow completed prep with 16 oz of clear liquid of your choice  (Nothing red or purple).  Continue to drink clear liquids until bedtime.  7.  Before going to bed, mix second dose of MoviPrep solution:    Empty 1 Pouch A and 1 Pouch B into the disposable container    Add lukewarm drinking water to the top line of the container. Mix to dissolve    Refrigerate  THE DAY OF YOUR PROCEDURE      DATE: Tuesday 6/7  Beginning at 5:00 a.m. (5 hours before procedure):         1. Every 15 minutes, drink the solution down to the next mark (approx 8 oz) until the full liter is complete.  2. Follow completed prep with 16 oz. of clear liquid of your choice.    3. You may drink clear liquids until 8:00 am (2 HOURS BEFORE PROCEDURE).   MEDICATION INSTRUCTIONS  Unless otherwise instructed, you should take regular prescription medications with a small sip of water   as early as possible the morning of  your procedure.   Additional medication instructions: hold lisinopril-hctz morning of procedure         OTHER INSTRUCTIONS  You will need a responsible adult at least 54 years of age to accompany you and drive you home.   This person must remain in the waiting room during your procedure.  Wear loose fitting clothing that is easily removed.  Leave jewelry and other valuables at home.  However, you may wish to bring a book to read or  an iPod/MP3 player to listen to music as you wait for your procedure to start.  Remove all body piercing jewelry and leave at home.  Total time from sign-in until discharge is approximately 2-3 hours.  You should go home directly after your procedure and rest.  You can resume normal activities the  day after your procedure.  The day of your procedure you should not:   Drive   Make legal decisions   Operate machinery   Drink alcohol   Return to work  You will receive specific instructions about eating, activities and medications before you leave.    The above instructions have been reviewed and explained to  me by   Sherren Kerns RN  August 29, 2009 1:18 PM    I fully understand and can verbalize these instructions _____________________________ Date _________

## 2010-04-30 NOTE — Assessment & Plan Note (Signed)
Summary: 6 m f/u htn/dlo   Vital Signs:  Patient profile:   55 year old male Height:      68 inches Weight:      230.0 pounds BMI:     35.10 Temp:     98.6 degrees F oral Pulse rate:   76 / minute Pulse rhythm:   regular BP sitting:   110 / 78  (left arm) Cuff size:   large  Vitals Entered By: Benny Lennert CMA Duncan Dull) (December 24, 2009 10:59 AM)  History of Present Illness: Chief complaint 6 month follow up   HTN, well controlled on lisinopril , HCTZ. Has started walking every day. Has eliminated fried foods in diet.  High cholesterol.Marland Kitchenat goal on simvastatin 40 mg daily.Marland Kitchenlast check 08/2009.   Has cut down to 6-7 cigarrettes a day.   Problems Prior to Update: 1)  Other Acute Sinusitis  (ICD-461.8) 2)  Weight Loss  (ICD-783.21) 3)  Tobacco Abuse  (ICD-305.1) 4)  Restless Legs Syndrome  (ICD-333.94) 5)  Skin Rash  (ICD-782.1) 6)  Pure Hypercholesterolemia  (ICD-272.0) 7)  Special Screening Malignant Neoplasm of Prostate  (ICD-V76.44) 8)  Preventive Health Care  (ICD-V70.0) 9)  Obstructive Sleep Apnea  (ICD-327.23) 10)  Hypertension  (ICD-401.9) 11)  Allergic Rhinitis  (ICD-477.9) 12)  Hx of Restless Leg Syndrome  (ICD-333.94) 13)  Schizophrenia  (ICD-295.90)  Current Medications (verified): 1)  Requip 1 Mg Tabs (Ropinirole Hcl) .... Take 1 Tablet By Mouth At Bedtime 2)  Claritin 10 Mg Tabs (Loratadine) .... Take 1 Tablet By Mouth Once A Day 3)  Artane 5mg  .... Take 1 Tablet By Mouth Two Times A Day 4)  Prolixin 5mg  and 2.5 Mg .... Patient Takes 7.5mg  At Bed Time 5)  Lisinopril-Hydrochlorothiazide 10-12.5 Mg Tabs (Lisinopril-Hydrochlorothiazide) .Marland Kitchen.. 1 Tab By Mouth Daily 6)  Simvastatin 40 Mg  Tabs (Simvastatin) .... Take 1 Tablet By Mouth Once A Day 7)  Fish Oil 1000 Mg Caps (Omega-3 Fatty Acids) .... Take 2 Tablet 2 Times Daily  Allergies: 1)  ! Chantix Continuing Month Pak (Varenicline Tartrate)  Past History:  Past medical, surgical, family and social  histories (including risk factors) reviewed, and no changes noted (except as noted below).  Past Medical History: Reviewed history from 06/21/2006 and no changes required. Allergic rhinitis Hypertension  Past Surgical History: Reviewed history from 06/21/2006 and no changes required. Tonsillectomy  Family History: Reviewed history from 03/27/2007 and no changes required. mother healthy father CVA, died of gallbladder disease and complications  Social History: Reviewed history from 03/27/2007 and no changes required. 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 CV:  Denies chest pain or discomfort. Resp:  Denies cough and shortness of breath. GI:  Denies abdominal pain. GU:  Denies dysuria.  Physical Exam  General:  Overweight appearing male inNAd Mouth:  MMM Neck:  no cervical or supraclavicular lymphadenopathy  Lungs:  Normal respiratory effort, chest expands symmetrically. Lungs are clear to auscultation, no crackles or wheezes. Heart:  Normal rate and regular rhythm. S1 and S2 normal without gallop, murmur, click, rub or other extra sounds. Abdomen:  Bowel sounds positive,abdomen soft and non-tender without masses, organomegaly or hernias noted. Pulses:  R and L carotid,radial,femoral,dorsalis pedis and posterior tibial pulses are full and equal bilaterally Extremities:  No clubbing, cyanosis, edema, or deformity noted with normal full range of motion of all joints.   Skin:  Intact without suspicious lesions or rashes  Impression & Recommendations:  Problem # 1:  HYPERTENSION (ICD-401.9) Well controlled. Continue current medication. Encouraged exercise, weight loss, healthy eating habits.  His updated medication list for this problem includes:    Lisinopril-hydrochlorothiazide 10-12.5 Mg Tabs (Lisinopril-hydrochlorothiazide) .Marland Kitchen... 1 tab by mouth daily  Problem # 2:  PURE HYPERCHOLESTEROLEMIA  (ICD-272.0) LDL at goal...trig high..likely improving with increase in exercise.  His updated medication list for this problem includes:    Simvastatin 40 Mg Tabs (Simvastatin) .Marland Kitchen... Take 1 tablet by mouth once a day  Complete Medication List: 1)  Requip 1 Mg Tabs (Ropinirole hcl) .... Take 1 tablet by mouth at bedtime 2)  Claritin 10 Mg Tabs (Loratadine) .... Take 1 tablet by mouth once a day 3)  Artane 5mg   .... Take 1 tablet by mouth two times a day 4)  Prolixin 5mg  and 2.5 Mg  .... Patient takes 7.5mg  at bed time 5)  Lisinopril-hydrochlorothiazide 10-12.5 Mg Tabs (Lisinopril-hydrochlorothiazide) .Marland Kitchen.. 1 tab by mouth daily 6)  Simvastatin 40 Mg Tabs (Simvastatin) .... Take 1 tablet by mouth once a day 7)  Fish Oil 1000 Mg Caps (Omega-3 fatty acids) .... Take 2 tablet 2 times daily  Patient Instructions: 1)  Schedule CPX in 05/2009 with fasting labs prior Dx 272.0 CMEt, lipids  Current Allergies (reviewed today): ! CHANTIX CONTINUING MONTH PAK (VARENICLINE TARTRATE)  Flu Vaccine Next Due:  Refused

## 2010-04-30 NOTE — Miscellaneous (Signed)
Summary: V12.72/ previsit/rm  Clinical Lists Changes  Medications: Added new medication of MOVIPREP 100 GM  SOLR (PEG-KCL-NACL-NASULF-NA ASC-C) As per prep instructions. - Signed Rx of MOVIPREP 100 GM  SOLR (PEG-KCL-NACL-NASULF-NA ASC-C) As per prep instructions.;  #1 x 0;  Signed;  Entered by: Sherren Kerns RN;  Authorized by: Meryl Dare MD [facility];  Method used: Electronically to [facility] # [phone]*, [address] [place], [place], [place]  [address], Ph: [phone], Fax: [phone] Observations: Added new observation of ALLERGY REV: Done (08/29/2009 12:36)    Prescriptions: MOVIPREP 100 GM  SOLR (PEG-KCL-NACL-NASULF-NA ASC-C) As per prep instructions.  #1 x 0   Entered by:   Sherren Kerns RN   Authorized by:   Meryl Dare MD [facility]   Signed by:   Sherren Kerns RN on 08/29/2009   Method used:   Electronically to        [facility] # [phone]* (retail)       [address] [place], [place]  [address]       Ph: [phone]       Fax: [phone]   RxID:   [id number]

## 2010-05-07 ENCOUNTER — Encounter: Payer: Self-pay | Admitting: Family Medicine

## 2010-06-08 ENCOUNTER — Ambulatory Visit (INDEPENDENT_AMBULATORY_CARE_PROVIDER_SITE_OTHER): Payer: Medicare Other | Admitting: Family Medicine

## 2010-06-08 ENCOUNTER — Encounter: Payer: Self-pay | Admitting: Family Medicine

## 2010-06-08 DIAGNOSIS — S139XXA Sprain of joints and ligaments of unspecified parts of neck, initial encounter: Secondary | ICD-10-CM | POA: Insufficient documentation

## 2010-06-16 NOTE — Assessment & Plan Note (Signed)
Summary: neck pain   Vital Signs:  Patient profile:   55 year old male Height:      68 inches Weight:      219.75 pounds BMI:     33.53 Temp:     97.5 degrees F oral Pulse rate:   76 / minute Pulse rhythm:   regular BP sitting:   142 / 80  (left arm) Cuff size:   large  Vitals Entered By: Lewanda Rife LPN (June 08, 2010 12:40 PM) CC: Neck pain, started on 06/05/10 No recent injury. Pain level now is 5.   History of Present Illness: woke up with neck pain on friday out of the blue L side - radiating into shoulder  is both sharp and dull pain   hurts most to turn L  hurts to look up and down or tilt as well   tried some ice and heat and massage- nothing helped  has tried some tylenol  used some ice hot - not really helpful     Allergies: 1)  ! Chantix Continuing Month Pak (Varenicline Tartrate)  Review of Systems General:  Denies fatigue and malaise. Eyes:  Denies blurring and eye irritation. CV:  Denies chest pain or discomfort, lightheadness, and palpitations. Resp:  Denies cough and wheezing. GI:  Denies nausea. MS:  Complains of muscle aches and stiffness; denies joint redness and joint swelling. Derm:  Denies itching, lesion(s), poor wound healing, and rash. Neuro:  Denies headaches, numbness, tingling, and weakness. Heme:  Denies abnormal bruising and bleeding.  Physical Exam  General:  Overweight appearing male inNAd Head:  normocephalic, atraumatic, and no abnormalities observed.   Eyes:  vision grossly intact, pupils equal, pupils round, and pupils reactive to light.   Mouth:  pharynx pink and moist.   Neck:  tender L paracervical musculature worse to rotate/ tilt L tender trapezius no bony tenderness Chest Wall:  No deformities, masses, tenderness or gynecomastia noted. Lungs:  Normal respiratory effort, chest expands symmetrically. Lungs are clear to auscultation, no crackles or wheezes. Heart:  Normal rate and regular rhythm. S1 and S2 normal  without gallop, murmur, click, rub or other extra sounds. Msk:  tender L trapezius  some tenderness mad to L scapula  nl rom UEs  Neurologic:  strength normal in all extremities, sensation intact to light touch, gait normal, and DTRs symmetrical and normal.   Skin:  Intact without suspicious lesions or rashes Cervical Nodes:  No lymphadenopathy noted Psych:  pt has somewhat irritable affect today   Impression & Recommendations:  Problem # 1:  CERVICAL MUSCLE STRAIN (ICD-847.0) Assessment New  with L sided pain and tightness rad into trapezius no neurol red flags recommend cervical support pillow if able  heat - gentle  flexeril as needed - with caution of sedation urged to call if worse or if not imp in a week His updated medication list for this problem includes:    Flexeril 10 Mg Tabs (Cyclobenzaprine hcl) .Marland Kitchen... 1 by mouth up to three times a day as needed for neck pain  this will sedate  Orders: Prescription Created Electronically [id number])  Complete Medication List: 1)  Requip 1 Mg Tabs (Ropinirole hcl) .... Take 1 tablet by mouth at bedtime 2)  Claritin 10 Mg Tabs (Loratadine) .... Take 1 tablet by mouth once a day 3)  Prolixin 5mg  and 2.5 Mg  .... Patient takes 7.5mg  at bed time 4)  Lisinopril-hydrochlorothiazide 10-12.5 Mg Tabs (Lisinopril-hydrochlorothiazide) .Marland Kitchen.. 1 tab by mouth daily 5)  Simvastatin 40 Mg Tabs (Simvastatin) .... Take 1 tablet by mouth once a day 6)  Fish Oil 1000 Mg Caps (Omega-3 fatty acids) .... Take 3 tablet 2 times daily 7)  Flexeril 10 Mg Tabs (Cyclobenzaprine hcl) .Marland Kitchen.. 1 by mouth up to three times a day as needed for neck pain  this will sedate  Patient Instructions: 1)  try to support your neck well on your pillow -- a foam cervical support pillow would be optimal  2)  use gentle heat - like a heating pad for 10 minutes at a time - to help relax the muscles  3)  try the flexeril to relax muscles  4)  if not improving in 1 week (or if worse)  please let me know  Prescriptions: FLEXERIL 10 MG TABS (CYCLOBENZAPRINE HCL) 1 by mouth up to three times a day as needed for neck pain  this will sedate  #30 x 0   Entered and Authorized by:   Judith Part MD   Signed by:   Judith Part MD on 06/08/2010   Method used:   [facility]        [facility]  [address] [facility]retail[facility]       [address]       [place], [place]  [address]       Ph: [phone] or [phone]       Fax: [phone]   RxID:   [id number]    Orders Added: 1)  Prescription Created Electronically [[id number]] 2)  Est. Patient Level III [63875]    Current Allergies (reviewed today): ! CHANTIX CONTINUING MONTH PAK (VARENICLINE TARTRATE)

## 2010-06-18 ENCOUNTER — Other Ambulatory Visit (INDEPENDENT_AMBULATORY_CARE_PROVIDER_SITE_OTHER): Payer: Medicare Other | Admitting: Family Medicine

## 2010-06-18 DIAGNOSIS — E7800 Pure hypercholesterolemia, unspecified: Secondary | ICD-10-CM

## 2010-06-18 LAB — HEPATIC FUNCTION PANEL
ALT: 22 U/L (ref 0–53)
Albumin: 4.1 g/dL (ref 3.5–5.2)
Bilirubin, Direct: 0.1 mg/dL (ref 0.0–0.3)
Total Protein: 7.1 g/dL (ref 6.0–8.3)

## 2010-06-18 LAB — BASIC METABOLIC PANEL
CO2: 25 mEq/L (ref 19–32)
GFR: 102.64 mL/min (ref >60.00)
Glucose, Bld: 91 mg/dL (ref 70–99)
Potassium: 4.5 mEq/L (ref 3.5–5.1)
Sodium: 127 mEq/L — ABNORMAL LOW (ref 135–145)

## 2010-06-18 LAB — LIPID PANEL: Total CHOL/HDL Ratio: 3

## 2010-06-23 ENCOUNTER — Other Ambulatory Visit: Payer: Self-pay | Admitting: Pulmonary Disease

## 2010-06-26 ENCOUNTER — Other Ambulatory Visit: Payer: Self-pay | Admitting: *Deleted

## 2010-06-26 NOTE — Telephone Encounter (Signed)
Opened in error. Medication already filled

## 2010-06-27 ENCOUNTER — Other Ambulatory Visit: Payer: Self-pay | Admitting: Family Medicine

## 2010-06-29 ENCOUNTER — Telehealth: Payer: Self-pay

## 2010-06-29 ENCOUNTER — Telehealth: Payer: Self-pay | Admitting: *Deleted

## 2010-06-29 MED ORDER — CYCLOBENZAPRINE HCL 10 MG PO TABS: 10.0000 mg | ORAL_TABLET | Freq: Three times a day (TID) | ORAL | Status: DC | PRN

## 2010-06-29 MED ORDER — CYCLOBENZAPRINE HCL 10 MG PO TABS: 10.0000 mg | ORAL_TABLET | Freq: Every day | ORAL | Status: DC | PRN

## 2010-06-29 NOTE — Telephone Encounter (Signed)
Call in prescription.

## 2010-06-29 NOTE — Telephone Encounter (Signed)
Px written for call in  Please ask pt how he is doing with neck pain- thanks

## 2010-06-29 NOTE — Telephone Encounter (Signed)
[facility] Triage Call Report Triage Record Num: [id number] Operator: Estevan Oaks Patient Name: Carl Contreras Call Date & Time: 06/26/2010 8:45:07PM Patient Phone: [phone] PCP: Patient Gender: Male PCP Fax : Patient DOB: 08-25-1955 Practice Name: [facility] Reason for Call: [place] Flex[facility]il. Callback number is [phone]. Rn obtains pt's contact # to triage sx's to determine if Flexeril from standing orders would be appropriate. No asnwer. Left mssg to call back. Protocol(s) Used: Office Note Recommended Outcome per Protocol: Information Noted and Sent to Office Reason for Outcome: Caller information to office Care Advice: ~ 06/26/2010 8:56:45PM Page 1 of 1 [email or web address]

## 2010-06-29 NOTE — Telephone Encounter (Signed)
Medication phoned to [facility] pharmacy as instructed. Spoke with April and she will add note to ask pt to call our office to f/u on how neck pain is doing. Unable to reach pt by phone.

## 2010-06-29 NOTE — Telephone Encounter (Signed)
[facility] pharmacy requesting a new rx for Cyclobenzaprine 10mg  1 pill  By mouth up to three times a day as needed for neck pain(caution of sedation). #30 was given on 06/08/10. Unable to reach pt by phone [phone] only contact #) to see how he is doing.Please advise.

## 2010-06-30 ENCOUNTER — Ambulatory Visit (INDEPENDENT_AMBULATORY_CARE_PROVIDER_SITE_OTHER): Payer: Medicare Other | Admitting: Family Medicine

## 2010-06-30 ENCOUNTER — Encounter: Payer: Self-pay | Admitting: Family Medicine

## 2010-06-30 DIAGNOSIS — E871 Hypo-osmolality and hyponatremia: Secondary | ICD-10-CM | POA: Insufficient documentation

## 2010-06-30 DIAGNOSIS — E7800 Pure hypercholesterolemia, unspecified: Secondary | ICD-10-CM

## 2010-06-30 DIAGNOSIS — I10 Essential (primary) hypertension: Secondary | ICD-10-CM

## 2010-06-30 DIAGNOSIS — S139XXA Sprain of joints and ligaments of unspecified parts of neck, initial encounter: Secondary | ICD-10-CM

## 2010-06-30 NOTE — Telephone Encounter (Signed)
Rx called to pharmacy

## 2010-06-30 NOTE — Assessment & Plan Note (Signed)
Triglcyerides are improving. Encouraged exercise, weight loss, healthy eating habits. LDL at goal <100 on statin.

## 2010-06-30 NOTE — Patient Instructions (Addendum)
Go back to regular amounts of salt and water. We will recheck sodium level in 1 week. Call if pain in neck and shoulder not continuing to improve.

## 2010-06-30 NOTE — Progress Notes (Signed)
Subjective:    Patient ID: Carl Contreras, male    DOB: [birth year], 55 y.o.   MRN: [id number]  HPI  Pt interested in changing practicces due to difficulty being seen, get through on phone and getting assistance he needs.  3/13... Neck and arm pain Dx with cervical strain... Given muscle relaxant.  No better...pain in neck now referred to left shoulder. No tingling or numbness. No weakness.  No fever.  Pain with moving neck.  Trouble sleeping at night.   So went to Urgent care 3 days ago.  Given stronger NSAIDs  and muscle relaxer.  Now pain is 50 % better.       Review of Systems  Constitutional: Positive for activity change. Negative for fever, fatigue and unexpected weight change.  HENT: Negative for ear pain, congestion, sore throat, rhinorrhea, trouble swallowing and postnasal drip.   Eyes: Negative for pain.  Respiratory: Negative for cough, shortness of breath and wheezing.   Cardiovascular: Negative for chest pain, palpitations and leg swelling.  Gastrointestinal: Negative for nausea, abdominal pain, diarrhea, constipation and blood in stool.  Genitourinary: Negative for dysuria, urgency, hematuria, discharge, penile swelling, scrotal swelling, difficulty urinating, penile pain and testicular pain.  Musculoskeletal: Positive for back pain.  Skin: Negative for rash.  Neurological: Negative for syncope, weakness, light-headedness, numbness and headaches.  Psychiatric/Behavioral: Negative for behavioral problems and dysphoric mood. The patient is not nervous/anxious.        Objective:   Physical Exam  Constitutional: He appears well-developed and well-nourished.  Non-toxic appearance. He does not appear ill. No distress.       Morbidly obese with central obesity.  HENT:  Head: Normocephalic and atraumatic.  Right Ear: Hearing, tympanic membrane, external ear and ear canal normal.  Left Ear: Hearing, tympanic membrane, external ear and ear canal normal.  Nose:  Nose normal.  Mouth/Throat: Uvula is midline, oropharynx is clear and moist and mucous membranes are normal.  Eyes: Conjunctivae, EOM and lids are normal. Pupils are equal, round, and reactive to light. No foreign bodies found.  Neck: Trachea normal, normal range of motion and phonation normal. Neck supple. Carotid bruit is not present. No mass and no thyromegaly present.  Cardiovascular: Normal rate, regular rhythm, S1 normal, S2 normal, intact distal pulses and normal pulses.  Exam reveals no gallop.   No murmur heard. Pulmonary/Chest: Breath sounds normal. He has no wheezes. He has no rhonchi. He has no rales.  Abdominal: Soft. Normal appearance and bowel sounds are normal. There is no hepatosplenomegaly. There is no tenderness. There is no rebound, no guarding and no CVA tenderness. No hernia. Hernia confirmed negative in the right inguinal area and confirmed negative in the left inguinal area.  Genitourinary: Prostate normal, testes normal and penis normal. Rectal exam shows no external hemorrhoid, no internal hemorrhoid, no fissure, no mass, no tenderness and anal tone normal. Guaiac negative stool. Prostate is not enlarged and not tender. Right testis shows no mass and no tenderness. Left testis shows no mass and no tenderness. No paraphimosis or penile tenderness.  Musculoskeletal:       Cervical back: He exhibits decreased range of motion, tenderness, pain and spasm. He exhibits no swelling, no edema and no deformity.       Arms:      Positive Spurling's to pain in upper arm but not down to hand.  Lymphadenopathy:    He has no cervical adenopathy.       Right: No inguinal adenopathy present.  Left: No inguinal adenopathy present.  Neurological: He is alert. He has normal strength and normal reflexes. No cranial nerve deficit or sensory deficit. Gait normal.  Skin: Skin is warm, dry and intact. No rash noted.  Psychiatric: Judgment normal. His affect is blunt. His speech is delayed.  He is slowed. Thought content is paranoid. Cognition and memory are normal.          Assessment & Plan:

## 2010-06-30 NOTE — Assessment & Plan Note (Signed)
With possible mild radiculopathy. Continue NSAIDs as given. Call if not continuing to improve.

## 2010-06-30 NOTE — Assessment & Plan Note (Addendum)
Currently in pain and on NSAIDs.Marland Kitchenblood pressure worse due to this.  On lisinopril HCTZ. Not checking at home.

## 2010-06-30 NOTE — Assessment & Plan Note (Signed)
Decrease salt restriction, decrease water intake.. Recheck in 1 week. If continued low.. Consider D/C HCTZ.

## 2010-08-03 ENCOUNTER — Other Ambulatory Visit: Payer: Self-pay | Admitting: Family Medicine

## 2010-10-05 ENCOUNTER — Telehealth: Payer: Self-pay | Admitting: *Deleted

## 2010-10-05 MED ORDER — CYCLOBENZAPRINE HCL 10 MG PO TABS: 10.0000 mg | ORAL_TABLET | Freq: Every day | ORAL | Status: DC | PRN

## 2010-10-05 NOTE — Telephone Encounter (Signed)
rx called to pharmacy and patient advised 

## 2010-10-05 NOTE — Telephone Encounter (Signed)
Patient called requesting a muscle relaxer for his back pain. Patient states that you have seen him for this in the past. Pharmacy; [facility]

## 2010-10-19 ENCOUNTER — Ambulatory Visit (INDEPENDENT_AMBULATORY_CARE_PROVIDER_SITE_OTHER)
Admission: RE | Admit: 2010-10-19 | Discharge: 2010-10-19 | Disposition: A | Discharge status: Home / Self Care | Payer: Medicare Other | Source: Ambulatory Visit | Attending: Family Medicine | Admitting: Family Medicine

## 2010-10-19 ENCOUNTER — Encounter: Payer: Self-pay | Admitting: Family Medicine

## 2010-10-19 ENCOUNTER — Ambulatory Visit (INDEPENDENT_AMBULATORY_CARE_PROVIDER_SITE_OTHER): Payer: Medicare Other | Admitting: Family Medicine

## 2010-10-19 ENCOUNTER — Telehealth: Payer: Self-pay | Admitting: *Deleted

## 2010-10-19 DIAGNOSIS — M5442 Lumbago with sciatica, left side: Secondary | ICD-10-CM | POA: Insufficient documentation

## 2010-10-19 DIAGNOSIS — M5430 Sciatica, unspecified side: Secondary | ICD-10-CM

## 2010-10-19 DIAGNOSIS — M545 Low back pain: Secondary | ICD-10-CM

## 2010-10-19 DIAGNOSIS — M5431 Sciatica, right side: Secondary | ICD-10-CM

## 2010-10-19 MED ORDER — CYCLOBENZAPRINE HCL 10 MG PO TABS: 10.0000 mg | ORAL_TABLET | Freq: Every day | ORAL | Status: DC | PRN

## 2010-10-19 MED ORDER — PREDNISONE 20 MG PO TABS: ORAL_TABLET | ORAL | Status: DC

## 2010-10-19 NOTE — Progress Notes (Signed)
  Subjective:    Patient ID: Carl Contreras, male    DOB: 17-Sep-1955, 55 y.o.   MRN: [id number]  HPI  55 year old male presents to clinic today with  3-4 weeks of right buttock pain, initial pain in low back (now resolved) In AMs has pain 10/10 on pain scale.  PAin shoots down right leg. Right leg weak, pain to put weight on it. Back of right leg numb. Carry's heavy loads, musical instruments.  Using ibuprofen, flexeril at bedtime.  Seeing chiropractor... No x-rays done.  Saw this AM..did acupuncture, ice,etc.    Review of Systems  Constitutional: Negative for fever and fatigue.  HENT: Negative for neck pain.   Eyes: Negative for pain.  Respiratory: Negative for shortness of breath.   Cardiovascular: Negative for chest pain, palpitations and leg swelling.  Neurological: Positive for weakness. Negative for tremors, seizures, syncope, numbness and headaches.       Objective:   Physical Exam  Constitutional: He appears well-developed and well-nourished.       Central obesity  HENT:  Head: Normocephalic and atraumatic.  Right Ear: External ear normal.  Left Ear: External ear normal.  Nose: Nose normal.  Mouth/Throat: Oropharynx is clear and moist. No oropharyngeal exudate.  Eyes: Pupils are equal, round, and reactive to light.  Neck: Normal range of motion. Neck supple.  Cardiovascular: Normal rate, regular rhythm, normal heart sounds and intact distal pulses.  Exam reveals no gallop and no friction rub.   No murmur heard. Pulmonary/Chest: Effort normal and breath sounds normal. No respiratory distress. He has no wheezes. He has no rales. He exhibits no tenderness.  Musculoskeletal:       Right hip: Normal.       Lumbar back: He exhibits decreased range of motion. He exhibits no tenderness and no bony tenderness.       ttp in right buttock at sciatic notch.  Positive SLR, and mildly positive faber's on right for SI joint pain.          Assessment & Plan:

## 2010-10-19 NOTE — Assessment & Plan Note (Signed)
Plus or minus right SI joint pain. X-ray shoewed no fracture and minimal arthritis.  Encouraged pt to work on weight loss overall as will decrease stress on back. Start prednsione course, contnue muscle relaxers.  Info given.. Start home exercises and heat.  Follow up in 2 weeks.

## 2010-10-19 NOTE — Telephone Encounter (Signed)
Patient's mom called stating that patient is having excruciating back and hip pain. Mom says that he is in so much pain that he can't even move. Mom wanted you to know that she is going to call 911 to pick him up.

## 2010-10-19 NOTE — Telephone Encounter (Signed)
Mother called back and spoke with Jacki Cones, patient has an appt to see you this afternoon.

## 2010-10-19 NOTE — Patient Instructions (Signed)
Start prednisone taper. Hold ibuprofen wile on prednisone  Use muscle relaxant as needed.  Call if pain worsening and not improving.  Heat and gentle exercise for  low back.  We will call with X-ray results. Follow up in 2 weeks.

## 2010-10-30 ENCOUNTER — Ambulatory Visit: Payer: Medicare Other | Admitting: Family Medicine

## 2010-11-06 ENCOUNTER — Other Ambulatory Visit: Payer: Self-pay | Admitting: Family Medicine

## 2010-11-10 ENCOUNTER — Encounter: Payer: Self-pay | Admitting: Family Medicine

## 2010-11-10 ENCOUNTER — Ambulatory Visit (INDEPENDENT_AMBULATORY_CARE_PROVIDER_SITE_OTHER): Payer: Medicare Other | Admitting: Family Medicine

## 2010-11-10 ENCOUNTER — Ambulatory Visit: Payer: Medicare Other | Admitting: Family Medicine

## 2010-11-10 VITALS — BP 128/78 | HR 88 | Temp 98.2°F | Wt 221.8 lb

## 2010-11-10 DIAGNOSIS — M5430 Sciatica, unspecified side: Secondary | ICD-10-CM

## 2010-11-10 DIAGNOSIS — M5431 Sciatica, right side: Secondary | ICD-10-CM

## 2010-11-10 MED ORDER — NAPROXEN 500 MG PO TABS: ORAL_TABLET | ORAL | Status: DC

## 2010-11-10 MED ORDER — CYCLOBENZAPRINE HCL 10 MG PO TABS: 10.0000 mg | ORAL_TABLET | Freq: Two times a day (BID) | ORAL | Status: DC | PRN

## 2010-11-10 NOTE — Progress Notes (Signed)
  Subjective:    Patient ID: Carl Contreras, male    DOB: 01/23/1956, 55 y.o.   MRN: [id number]  HPI CC: f/u sciatica  New pt to me with h/o schizophrenia presents for f/u sciatica.  Dx 2 wks ago with R sciatica, treated with prednisone taper as well as flexeril.  Xrays negative for fracture and minimal arthritis.  Medicines helped a lot.  Tolerates ibuprofen 800mg  bid fine now that done with prednisone.    Pain remaining - R buttock and travels to knee down posterior thigh.  States more than anything annoying.  Especially noted in am's.  Hurts to walk and sit down.  As day progresses, pain improves.  Intermittent sxs at night.  Also doing stretching exercises, helps if keeps on doing these.  No fevers/chills, no bowel/bladder accidents.  Denies inciting event/trauma  Seen chiropractor.  Review of Systems Per HPI    Objective:   Physical Exam  Nursing note and vitals reviewed. Constitutional: He appears well-developed and well-nourished. No distress.  Musculoskeletal: Normal range of motion. He exhibits no edema.       No midline spine tenderness No paraspinous mm tenderness/tightness Neg SLR bilaterally No pain with int/ext rotation at hips No pain at SIJ or GTB bilaterally + tender R buttock at sacral notch          Assessment & Plan:

## 2010-11-10 NOTE — Assessment & Plan Note (Signed)
Overall improved. Continue exercises, change ibuprofen to naprosyn, hopeful for longer duratino of relief.  Continue flexeril. Advised if not improving as expected, could call us for PT referral, but anticipate continued resolution as up to now.

## 2010-11-10 NOTE — Patient Instructions (Signed)
Looks like continued sciatica, but I'm glad it's improving.   Continue flexeril. Start naprosyn twice daily with food as needed.  Stop ibuprofen.  If can't tolerate naprosyn, my restart ibuprofen If not improving, let us know for referral to physical therapy to work with you on stretches.

## 2010-12-25 ENCOUNTER — Other Ambulatory Visit: Payer: Self-pay | Admitting: *Deleted

## 2010-12-25 MED ORDER — CYCLOBENZAPRINE HCL 10 MG PO TABS: 10.0000 mg | ORAL_TABLET | Freq: Two times a day (BID) | ORAL | Status: DC | PRN

## 2010-12-25 MED ORDER — NAPROXEN 500 MG PO TABS: ORAL_TABLET | ORAL | Status: DC

## 2010-12-25 NOTE — Telephone Encounter (Signed)
Ok to refill 

## 2011-01-13 ENCOUNTER — Other Ambulatory Visit: Payer: Self-pay | Admitting: *Deleted

## 2011-01-13 MED ORDER — SIMVASTATIN 40 MG PO TABS: 40.0000 mg | ORAL_TABLET | Freq: Every day | ORAL | Status: DC

## 2011-04-06 ENCOUNTER — Other Ambulatory Visit: Payer: Self-pay | Admitting: Internal Medicine

## 2011-04-06 NOTE — Telephone Encounter (Signed)
Patient requesting refill. 

## 2011-04-07 MED ORDER — CYCLOBENZAPRINE HCL 10 MG PO TABS: 10.0000 mg | ORAL_TABLET | Freq: Two times a day (BID) | ORAL | Status: DC | PRN

## 2011-04-07 MED ORDER — LISINOPRIL-HYDROCHLOROTHIAZIDE 10-12.5 MG PO TABS: 1.0000 | ORAL_TABLET | Freq: Every day | ORAL | Status: DC

## 2011-04-12 ENCOUNTER — Other Ambulatory Visit: Payer: Self-pay | Admitting: Family Medicine

## 2011-04-12 ENCOUNTER — Other Ambulatory Visit: Payer: Self-pay | Admitting: Internal Medicine

## 2011-04-12 MED ORDER — NAPROXEN 500 MG PO TABS: ORAL_TABLET | ORAL | Status: AC

## 2011-04-12 MED ORDER — CYCLOBENZAPRINE HCL 10 MG PO TABS: 10.0000 mg | ORAL_TABLET | Freq: Two times a day (BID) | ORAL | Status: AC | PRN

## 2011-04-12 MED ORDER — LISINOPRIL-HYDROCHLOROTHIAZIDE 10-12.5 MG PO TABS: 1.0000 | ORAL_TABLET | Freq: Every day | ORAL | Status: DC

## 2011-04-12 NOTE — Telephone Encounter (Signed)
Rx sent to pharmacy   

## 2011-04-13 ENCOUNTER — Encounter: Payer: Self-pay | Admitting: Family Medicine

## 2011-04-13 ENCOUNTER — Ambulatory Visit (INDEPENDENT_AMBULATORY_CARE_PROVIDER_SITE_OTHER): Payer: Medicare Other | Admitting: Family Medicine

## 2011-04-13 DIAGNOSIS — Z0000 Encounter for general adult medical examination without abnormal findings: Secondary | ICD-10-CM

## 2011-04-13 DIAGNOSIS — I10 Essential (primary) hypertension: Secondary | ICD-10-CM

## 2011-04-13 DIAGNOSIS — Z125 Encounter for screening for malignant neoplasm of prostate: Secondary | ICD-10-CM

## 2011-04-13 DIAGNOSIS — F17200 Nicotine dependence, unspecified, uncomplicated: Secondary | ICD-10-CM

## 2011-04-13 DIAGNOSIS — E7800 Pure hypercholesterolemia, unspecified: Secondary | ICD-10-CM

## 2011-04-13 NOTE — Assessment & Plan Note (Signed)
The patient was counseled on the dangers of tobacco use, and was advised to quit.  Reviewed strategies to maximize success, including removing cigarettes and smoking materials from environment, stress management, substitution of other forms of reinforcement and support of family/friends. 

## 2011-04-13 NOTE — Assessment & Plan Note (Signed)
Well controlled. Continue current medication.  

## 2011-04-13 NOTE — Patient Instructions (Addendum)
Return fasting labs in few days.   Work on [employer], weight loss and exercise. QUIT smoking. Consider trying nicotrol inhalers for nicotine replacement.

## 2011-04-13 NOTE — Assessment & Plan Note (Signed)
Due for re-eval. 

## 2011-04-13 NOTE — Progress Notes (Signed)
Subjective:    Patient ID: Carl Contreras, male    DOB: [birth year], 56 y.o.   MRN: [id number]  HPI  I have personally reviewed the Medicare Annual Wellness questionnaire and have noted 1. The patient's medical and social history 2. Their use of alcohol, tobacco or illicit drugs 3. Their current medications and supplements 4. The patient's functional ability including ADL's, fall risks, home safety risks and hearing or visual             impairment. 5. Diet and physical activities 6. Evidence for depression or mood disorders The patients weight, height, BMI and visual acuity have been recorded in the chart I have made referrals, counseling and provided education to the patient based review of the above and I have provided the pt with a written personalized care plan for preventive services.  Back is better.  Schizophrenia: stable per pt on fluphenazine.    Hypertension:  Well controlled on lisinopril/HCTZ.  Using medication without problems or lightheadedness:  None Chest pain with exertion:None Edema:None Short of breath:None Average home BPs:not chekcing. Other issues:  Elevated Cholesterol: On simvastain 40 mg daily.. Due for re-eval. Using medications without problems: None Muscle aches: None Diet compliance:Moderate. Exercise: Walks some daily...20-30 min Other complaints:  Still smoking.Marland Kitchen Has cut back to 7-10 cigs a day. Has not liked nicotine patches or gum in past.  Review of Systems  Constitutional: Negative for fever, fatigue and unexpected weight change.  HENT: Negative for ear pain, congestion, sore throat, rhinorrhea, trouble swallowing and postnasal drip.   Eyes: Negative for pain.  Respiratory: Negative for cough, shortness of breath and wheezing.   Cardiovascular: Negative for chest pain, palpitations and leg swelling.  Gastrointestinal: Negative for nausea, abdominal pain, diarrhea, constipation and blood in stool.  Genitourinary: Negative for  dysuria, urgency, hematuria, discharge, penile swelling, scrotal swelling, difficulty urinating, penile pain and testicular pain.  Skin: Negative for rash.  Neurological: Negative for syncope, weakness, light-headedness, numbness and headaches.  Psychiatric/Behavioral: Negative for behavioral problems and dysphoric mood. The patient is not nervous/anxious.        Objective:   Physical Exam  Constitutional: He appears well-developed and well-nourished.  Non-toxic appearance. He does not appear ill. No distress.  HENT:  Head: Normocephalic and atraumatic.  Right Ear: Hearing, tympanic membrane, external ear and ear canal normal.  Left Ear: Hearing, tympanic membrane, external ear and ear canal normal.  Nose: Nose normal.  Mouth/Throat: Uvula is midline, oropharynx is clear and moist and mucous membranes are normal.  Eyes: Conjunctivae, EOM and lids are normal. Pupils are equal, round, and reactive to light. No foreign bodies found.  Neck: Trachea normal, normal range of motion and phonation normal. Neck supple. Carotid bruit is not present. No mass and no thyromegaly present.  Cardiovascular: Normal rate, regular rhythm, S1 normal, S2 normal, intact distal pulses and normal pulses.  Exam reveals no gallop.   No murmur heard. Pulmonary/Chest: Breath sounds normal. He has no wheezes. He has no rhonchi. He has no rales.  Abdominal: Soft. Normal appearance and bowel sounds are normal. There is no hepatosplenomegaly. There is no tenderness. There is no rebound, no guarding and no CVA tenderness. No hernia. Hernia confirmed negative in the right inguinal area and confirmed negative in the left inguinal area.  Genitourinary: Prostate normal, testes normal and penis normal. Rectal exam shows no external hemorrhoid, no internal hemorrhoid, no fissure, no mass, no tenderness and anal tone normal. Guaiac negative stool. Prostate is not enlarged and  not tender. Right testis shows no mass and no tenderness.  Left testis shows no mass and no tenderness. No paraphimosis or penile tenderness.  Lymphadenopathy:    He has no cervical adenopathy.       Right: No inguinal adenopathy present.       Left: No inguinal adenopathy present.  Neurological: He is alert. He has normal strength and normal reflexes. No cranial nerve deficit or sensory deficit. Gait normal.  Skin: Skin is warm, dry and intact. No rash noted.  Psychiatric: He has a normal mood and affect. His speech is normal and behavior is normal. Judgment normal.          Assessment & Plan:  AMW: The patient's preventative maintenance and recommended screening tests for an annual wellness exam were reviewed in full today. Brought up to date unless services declined.  Counselled on the importance of diet, exercise, and its role in overall health and mortality. The patient's FH and SH was reviewed, including their home life, tobacco status, and drug and alcohol status.   Vaccine; refuses flu, uptodate with Tdap. Colon: 2011, Dr. Russella Dar.. Recommended repeat in 3 years.  Prostate:  Will check PSA today and perform prostate exam.

## 2011-07-15 ENCOUNTER — Telehealth: Payer: Self-pay

## 2011-07-15 NOTE — Telephone Encounter (Signed)
Pts mother request pts charges from 06/28/10 thru 06/27/11 for social security benefits. Pts son gave permission to speak with pts mother. Gave pt [phone] or [phone] for information.

## 2011-09-29 ENCOUNTER — Other Ambulatory Visit: Payer: Self-pay | Admitting: Family Medicine

## 2011-10-15 ENCOUNTER — Ambulatory Visit: Payer: Medicare Other | Admitting: Family Medicine

## 2011-10-21 ENCOUNTER — Other Ambulatory Visit: Payer: Self-pay

## 2011-10-21 NOTE — Telephone Encounter (Signed)
Pt request refill on heart/BP med; pt said has bottle at home; when asked if name was lisinopril HCTZ pt said yes. Rob at [facility] said pt has 2 refills waiting at pharmacy for Lisinopril HCTZ. Advised pt to verify with bottle at home the name of med and if is lisinopril HCTZ pt is to ck with [facility] for refill.

## 2011-12-28 ENCOUNTER — Other Ambulatory Visit: Payer: Self-pay | Admitting: Family Medicine

## 2012-06-27 ENCOUNTER — Encounter: Payer: Self-pay | Admitting: Family Medicine

## 2012-06-27 ENCOUNTER — Ambulatory Visit (INDEPENDENT_AMBULATORY_CARE_PROVIDER_SITE_OTHER): Payer: Medicare Other | Admitting: Family Medicine

## 2012-06-27 VITALS — BP 118/68 | HR 76 | Temp 98.3°F | Wt 228.0 lb

## 2012-06-27 DIAGNOSIS — I10 Essential (primary) hypertension: Secondary | ICD-10-CM

## 2012-06-27 DIAGNOSIS — E871 Hypo-osmolality and hyponatremia: Secondary | ICD-10-CM

## 2012-06-27 DIAGNOSIS — E7800 Pure hypercholesterolemia, unspecified: Secondary | ICD-10-CM

## 2012-06-27 MED ORDER — LISINOPRIL-HYDROCHLOROTHIAZIDE 10-12.5 MG PO TABS: 1.0000 | ORAL_TABLET | Freq: Every day | ORAL | Status: DC

## 2012-06-27 MED ORDER — SIMVASTATIN 40 MG PO TABS: ORAL_TABLET | ORAL | Status: DC

## 2012-06-27 NOTE — Assessment & Plan Note (Signed)
Well controlled. Continue current medication.  

## 2012-06-27 NOTE — Patient Instructions (Addendum)
Schedule medicare wellness in next 3-6 months with fasting labs prior.  Work on exercise, weight loss, healthy eating habits.

## 2012-06-27 NOTE — Assessment & Plan Note (Signed)
Due for re-eval. 

## 2012-06-27 NOTE — Progress Notes (Signed)
  Subjective:    Patient ID: Carl Contreras, male    DOB: Jun 13, 1955, 57 y.o.   MRN: [id number]  HPI  57 year old male with schizophrenia presents for follow up medical issues. Overdue for medicare wellness.  Hypertension: Well controlled on lisinopril/HCTZ.  Using medication without problems or lightheadedness: None  Chest pain with exertion:None  Edema:None  Short of breath:None  Average home BPs:not checking.  Other issues:   Elevated Cholesterol: On simvastain 40 mg daily..  Due for re-eval. Lab Results  Component Value Date   CHOL 132 06/18/2010   HDL 40.40 06/18/2010   LDLCALC 59 06/18/2010   LDLDIRECT 72.7 06/11/2009   TRIG 162.0* 06/18/2010   CHOLHDL 3 06/18/2010   Using medications without problems: None  Muscle aches: None  Diet compliance:Moderate.  Exercise: Walks some daily...30-40 min  Other complaints:  Still smoking.Marland Kitchen Has cut back to 7-10 cigs a day.  Has not liked nicotine patches or gum in past.   Wt Readings from Last 3 Encounters:  06/27/12 228 lb (103.42 kg)  04/13/11 236 lb 6.4 oz (107.23 kg)  11/10/10 221 lb 12 oz (100.585 kg)   Lives with Mom.     Review of Systems     Objective:   Physical Exam        Assessment & Plan:

## 2012-07-06 ENCOUNTER — Encounter: Payer: Self-pay | Admitting: Family Medicine

## 2012-07-06 ENCOUNTER — Ambulatory Visit (INDEPENDENT_AMBULATORY_CARE_PROVIDER_SITE_OTHER): Payer: Medicare Other | Admitting: Family Medicine

## 2012-07-06 VITALS — BP 110/76 | HR 72 | Temp 98.0°F | Ht 69.0 in | Wt 224.8 lb

## 2012-07-06 DIAGNOSIS — M5442 Lumbago with sciatica, left side: Secondary | ICD-10-CM

## 2012-07-06 DIAGNOSIS — M5430 Sciatica, unspecified side: Secondary | ICD-10-CM

## 2012-07-06 MED ORDER — PREDNISONE 20 MG PO TABS: ORAL_TABLET | ORAL | Status: DC

## 2012-07-06 NOTE — Progress Notes (Signed)
  Subjective:    Patient ID: Carl Contreras, male    DOB: [birth year], 56 y.o.   MRN: [id number]  HPI  57 year old male presents with low back pain after lifting a lazyboy 4-5 weeks ago. He began having low back tightness and pain.  Saw chiropractor weekly in past few weeks. Low back pain resolved but then in last few weeks pain moved to left buttock. Pain radiates to left upper thigh. No weakness or numbness.  No fever. No incontinence. Pain is more with leaning forward.  Not using any med for pain, applying ice. Has been doing home stretching.      Review of Systems  Constitutional: Negative for fever and fatigue.  HENT: Negative for congestion.   Respiratory: Negative for shortness of breath.   Cardiovascular: Negative for chest pain.  Gastrointestinal: Negative for abdominal pain.       Objective:   Physical Exam  Constitutional: He is oriented to person, place, and time. He appears well-developed and well-nourished.  Central obesity  Neck: Normal range of motion. Neck supple. No thyromegaly present.  Cardiovascular: Normal rate and regular rhythm.   Pulmonary/Chest: Effort normal and breath sounds normal.  Abdominal: Soft. Bowel sounds are normal. There is no tenderness.  Musculoskeletal:       Lumbar back: He exhibits decreased range of motion. He exhibits no tenderness and no bony tenderness.  ttp in left buttock... Positive SLR on left.. Cannot straighten left leg with out pain increasing and radiating to left thigh.  Neurological: He is alert and oriented to person, place, and time. He has normal reflexes. No cranial nerve deficit. Coordination normal.          Assessment & Plan:

## 2012-07-06 NOTE — Assessment & Plan Note (Signed)
NSAID, stretching, heat and exercsie.  Follow up in 2 weeks for re-eval.

## 2012-07-06 NOTE — Patient Instructions (Addendum)
Continue heat, and stretching exercise. Start 6 day taper of prednisone. When this is completed you can go to iburofen 800 mg every 8 hours.  Follow up 2 weeeks... But call if not better with prednisone.

## 2012-07-11 ENCOUNTER — Ambulatory Visit (INDEPENDENT_AMBULATORY_CARE_PROVIDER_SITE_OTHER): Payer: Medicare Other | Admitting: Family Medicine

## 2012-07-11 ENCOUNTER — Encounter: Payer: Self-pay | Admitting: Family Medicine

## 2012-07-11 VITALS — BP 130/70 | HR 92 | Temp 98.2°F | Ht 69.0 in | Wt 228.2 lb

## 2012-07-11 DIAGNOSIS — M5442 Lumbago with sciatica, left side: Secondary | ICD-10-CM

## 2012-07-11 DIAGNOSIS — M5430 Sciatica, unspecified side: Secondary | ICD-10-CM

## 2012-07-11 NOTE — Assessment & Plan Note (Signed)
Improving. Change to ibuprofen three times a day scheduled then as needed. Stretching and heat.   Spent 15-20 min discussing how to take medication appropriately given pt confusion. He was able to repeat back to me how to take the med.

## 2012-07-11 NOTE — Patient Instructions (Addendum)
Continue stretching  2-3 times a day and heat. Now only take ibuprofen 3 tablet three times a day with food for pain.

## 2012-07-11 NOTE — Progress Notes (Signed)
  Subjective:    Patient ID: Carl Contreras, male    DOB: [birth year], 57 y.o.   MRN: [id number]  HPI 57 year old male presents for 5 day follow up on lumbago with left sided sciatica.  He was treated with prednisone taper, muscle relaxant as well as gentle stretching and heat.  He took the prednisione taper incorrectly. Took 6 tabs on first day.. 3 in AM and 3 at noight.   He reports today that  He feels much better. 2/10 pon pain scale. Tightness in back of legs. Walking a lot.   Review of Systems  Constitutional: Negative for fever and fatigue.  HENT: Negative for ear pain.   Eyes: Negative for pain.  Respiratory: Negative for shortness of breath and wheezing.   Cardiovascular: Negative for chest pain.  Gastrointestinal: Negative for abdominal pain.       Objective:   Physical Exam  Constitutional: He is oriented to person, place, and time. He appears well-developed and well-nourished.  Central obesity  Neck: Normal range of motion. Neck supple. No thyromegaly present.  Cardiovascular: Normal rate and regular rhythm.   Pulmonary/Chest: Effort normal and breath sounds normal.  Abdominal: Soft. Bowel sounds are normal. There is no tenderness.  Musculoskeletal:       Lumbar back: He exhibits decreased range of motion. He exhibits no tenderness and no bony tenderness.  ttp in left buttock... Positive SLR on left.. Cannot straighten left leg with out pain increasing and radiating to left thigh.  Neurological: He is alert and oriented to person, place, and time. He has normal reflexes. No cranial nerve deficit. Coordination normal.          Assessment & Plan:

## 2012-07-26 ENCOUNTER — Ambulatory Visit (INDEPENDENT_AMBULATORY_CARE_PROVIDER_SITE_OTHER): Payer: Medicare Other | Admitting: Family Medicine

## 2012-07-26 ENCOUNTER — Encounter: Payer: Self-pay | Admitting: Family Medicine

## 2012-07-26 VITALS — BP 120/72 | HR 75 | Temp 98.3°F | Ht 69.0 in | Wt 228.5 lb

## 2012-07-26 DIAGNOSIS — F209 Schizophrenia, unspecified: Secondary | ICD-10-CM

## 2012-07-26 DIAGNOSIS — M5442 Lumbago with sciatica, left side: Secondary | ICD-10-CM

## 2012-07-26 DIAGNOSIS — M5430 Sciatica, unspecified side: Secondary | ICD-10-CM

## 2012-07-26 MED ORDER — TIZANIDINE HCL 4 MG PO TABS: 4.0000 mg | ORAL_TABLET | Freq: Every evening | ORAL | Status: DC

## 2012-07-26 NOTE — Progress Notes (Signed)
Nature conservation officer at [facility] [address] [place] [address] Phone: [phone] Fax: [phone]  Date:  07/26/2012   Name:  Carl Contreras   DOB:  [birth year]   MRN:  [id number] Gender: male Age: 57 y.o.  Primary Physician:  Kerby Nora, MD  Evaluating MD: Hannah Beat, MD   Chief Complaint: Back Pain   History of Present Illness:  Carl Contreras is a 57 y.o. pleasant patient who presents with the following:  Dr. Ermalene Searing patient with acute back pain, 2 recent visits with  Picked up a lazy boy, then sciatic nerve on the left side.  Pain radiating down to the left knee. Has been walking. Taking ibuprofen, using heat pad.  No numbness, no tingling. Has been taking ibuprofen. Still with some pain, maybe doing better, particularly at the end of the day. Legs still "tight". No incontinence.  Schizophrenia under control.    Prior OV: 57 year old male presents for 5 day follow up on lumbago with left sided sciatica.  He was treated with prednisone taper, muscle relaxant as well as gentle stretching and heat.  He took the prednisione taper incorrectly. Took 6 tabs on first day.. 3 in AM and 3 at noight.    He reports today that  He feels much better. 2/10 pon pain scale. Tightness in back of legs. Walking a lot.   Patient Active Problem List   Diagnosis Date Noted  . Lumbago with sciatica of left side 10/19/2010  . Hyponatremia 06/30/2010  . TOBACCO ABUSE 05/14/2008  . PURE HYPERCHOLESTEROLEMIA 04/05/2007  . OBSTRUCTIVE SLEEP APNEA 03/13/2007  . Restless Legs Syndrome (RLS) 06/22/2006  . HYPERTENSION 06/22/2006  . ALLERGIC RHINITIS 06/22/2006  . SCHIZOPHRENIA 06/21/2006    Past Medical History  Diagnosis Date  . Allergic rhinitis   . Hypertension     Past Surgical History  Procedure Laterality Date  . Tonsillectomy      History   Social History  . Marital Status: Single    Spouse Name: N/A    Number of Children: N/A  . Years  of Education: N/A   Occupational History  . professional musician    Social History Main Topics  . Smoking status: Current Every Day Smoker -- 0.50 packs/day    Types: Cigarettes  . Smokeless tobacco: Not on file  . Alcohol Use: No  . Drug Use: No  . Sexually Active: Not on file   Other Topics Concern  . Not on file   Social History Narrative   No living will, HCPOA: mother Reo Portela    Family History  Problem Relation Age of Onset  . Gallbladder disease      Allergies  Allergen Reactions  . Varenicline Tartrate     REACTION: nausea and vomiting    Medication list has been reviewed and updated.  Outpatient Prescriptions Prior to Visit  Medication Sig Dispense Refill  . fluPHENAZine (PROLIXIN) 5 MG tablet Take 2.5 mg by mouth. Take 7.5 mg at bedtime       . lisinopril-hydrochlorothiazide (PRINZIDE,ZESTORETIC) 10-12.5 MG per tablet Take 1 tablet by mouth daily.  90 tablet  3  . loratadine (CLARITIN) 10 MG tablet Take 10 mg by mouth daily.        . Omega-3 Fatty Acids (OMEGA-3 FISH OIL) 1000 MG CAPS Take by mouth. Take 3 tablet 2 times daily       . rOPINIRole (REQUIP) 1 MG tablet TAKE 1 TABLET AT BEDTIME  90 tablet  3  . simvastatin (ZOCOR) 40 MG tablet TAKE 1 TABLET AT BEDTIME  90 tablet  3   No facility-administered medications prior to visit.    Review of Systems:   GEN: No fevers, chills. Nontoxic. Primarily MSK c/o today. MSK: Detailed in the HPI GI: tolerating PO intake without difficulty Neuro: No numbness, parasthesias, or tingling associated. Otherwise the pertinent positives of the ROS are noted above.    Physical Examination: BP 120/72  Pulse 75  Temp(Src) 98.3 F (36.8 C) (Oral)  Ht 5\' 9"  (1.753 m)  Wt 228 lb 8 oz (103.647 kg)  BMI 33.73 kg/m2  SpO2 99%  Ideal Body Weight: Weight in (lb) to have BMI = 25: 168.9   GEN: Well-developed,well-nourished,in no acute distress; alert,appropriate and cooperative throughout  examination HEENT: Normocephalic and atraumatic without obvious abnormalities. Ears, externally no deformities PULM: Breathing comfortably in no respiratory distress EXT: No clubbing, cyanosis, or edema PSYCH: Normally interactive. Cooperative during the interview. Pleasant. Friendly and conversant. Not anxious or depressed appearing. Normal, full affect.  Range of motion at  the waist: Flexion, extension, lateral bending and rotation: flexion relatively preserved. Some ext limitation  No echymosis or edema Rises to examination table with mild difficulty Gait: minimally antalgic  Inspection/Deformity: N Paraspinus Tenderness: L4-s1  B Ankle Dorsiflexion (L5,4): 5/5 B Great Toe Dorsiflexion (L5,4): 5/5 Heel Walk (L5): WNL Toe Walk (S1): WNL Rise/Squat (L4): WNL, mild pain  SENSORY B Medial Foot (L4): WNL B Dorsum (L5): WNL B Lateral (S1): WNL Light Touch: WNL  REFLEXES Knee (L4): 2+ Ankle (S1): 2+  B SLR, seated: neg B Greater Troch: NT B Stork: NT B Sciatic Notch: NT  Assessment and Plan:  Lumbago with sciatica of left side  SCHIZOPHRENIA   In looking at case, likely disc herniation at time of lifting furniture. Persistent, signficant symptoms 2 months out. With history of schizophrenia, he and discussed and concurred that being more conservative makes the most sense. While stable on Prolixin, ESI could potentially exacerbate schizophrenia, so I would be cautious and be more conservative than most cases.  Orders Today:  No orders of the defined types were placed in this encounter.    Updated Medication List: (Includes new medications, updates to list, dose adjustments) Meds ordered this encounter  Medications  . tiZANidine (ZANAFLEX) 4 MG tablet    Sig: Take 1 tablet (4 mg total) by mouth Nightly.    Dispense:  30 tablet    Refill:  2    Medications Discontinued: There are no discontinued medications.   Signed, Elpidio Galea. Rebbecca Osuna, MD 07/26/2012 11:40 AM

## 2012-08-15 ENCOUNTER — Encounter: Payer: Self-pay | Admitting: Gastroenterology

## 2012-10-24 ENCOUNTER — Other Ambulatory Visit (INDEPENDENT_AMBULATORY_CARE_PROVIDER_SITE_OTHER): Payer: Medicare Other

## 2012-10-24 DIAGNOSIS — Z125 Encounter for screening for malignant neoplasm of prostate: Secondary | ICD-10-CM

## 2012-10-24 DIAGNOSIS — E7800 Pure hypercholesterolemia, unspecified: Secondary | ICD-10-CM

## 2012-10-24 DIAGNOSIS — E871 Hypo-osmolality and hyponatremia: Secondary | ICD-10-CM

## 2012-10-24 DIAGNOSIS — I10 Essential (primary) hypertension: Secondary | ICD-10-CM

## 2012-10-24 LAB — COMPREHENSIVE METABOLIC PANEL
BUN: 9 mg/dL (ref 6–23)
CO2: 27 mEq/L (ref 19–32)
Creatinine, Ser: 1 mg/dL (ref 0.4–1.5)
GFR: 82.07 mL/min (ref >60.00)
Glucose, Bld: 84 mg/dL (ref 70–99)
Total Bilirubin: 0.8 mg/dL (ref 0.3–1.2)
Total Protein: 7 g/dL (ref 6.0–8.3)

## 2012-10-24 LAB — LIPID PANEL
Cholesterol: 131 mg/dL (ref 0–200)
HDL: 44.8 mg/dL (ref >39.00)
Triglycerides: 63 mg/dL (ref 0.0–149.0)

## 2012-10-24 LAB — PSA: PSA: 0.52 ng/mL (ref 0.10–4.00)

## 2012-10-27 ENCOUNTER — Ambulatory Visit (INDEPENDENT_AMBULATORY_CARE_PROVIDER_SITE_OTHER): Payer: Medicare Other | Admitting: Family Medicine

## 2012-10-27 ENCOUNTER — Encounter: Payer: Self-pay | Admitting: Family Medicine

## 2012-10-27 VITALS — BP 124/80 | HR 84 | Temp 98.1°F | Ht 69.0 in | Wt 224.0 lb

## 2012-10-27 DIAGNOSIS — I10 Essential (primary) hypertension: Secondary | ICD-10-CM

## 2012-10-27 DIAGNOSIS — E7800 Pure hypercholesterolemia, unspecified: Secondary | ICD-10-CM

## 2012-10-27 DIAGNOSIS — E871 Hypo-osmolality and hyponatremia: Secondary | ICD-10-CM

## 2012-10-27 NOTE — Assessment & Plan Note (Signed)
Pt has been salt restricting and drinks a lot of fluids. We will have him change these habits. Recheck Na in 2 weeks if not improved we may need to D/C HCTZ component of BP med which is likely contributing.

## 2012-10-27 NOTE — Progress Notes (Signed)
  Subjective:    Patient ID: Carl Contreras, male    DOB: May 06, 1955, 57 y.o.   MRN: [id number]  HPI  57 year old male with schizophrenia presents for follow up medical issues.   Back pain has improved.  he has been walking and decreasing po intake, eating healthier. Wt Readings from Last 3 Encounters:  10/27/12 224 lb (101.606 kg)  07/26/12 228 lb 8 oz (103.647 kg)  07/11/12 228 lb 4 oz (103.534 kg)   He was seen in [place] ER 2 months ago...for knee injury after fall. Has allergic reaction to bandage following. Given steroids and he hasd SE>   Hypertension: Well controlled on lisinopril/HCTZ.  Using medication without problems or lightheadedness: None  Chest pain with exertion:None  Edema:None  Short of breath:None  Average home BPs:not checking.  Other issues:   Elevated Cholesterol: Well controlled on simvastain 40 mg daily..  Lab Results  Component Value Date   CHOL 131 10/24/2012   HDL 44.80 10/24/2012   LDLCALC 74 10/24/2012   LDLDIRECT 72.7 06/11/2009   TRIG 63.0 10/24/2012   CHOLHDL 3 10/24/2012  Using medications without problems: None  Muscle aches: None  Diet compliance:Moderate.  Exercise: Walks daily...30-40 min  Other complaints:  Still smoking.Marland Kitchen Has cut back to 7-10 cigs a day.  Has not liked nicotine patches or gum in past.   Hyponatremia: he has been sodium a lot and drinking more water.   Review of Systems  Constitutional: Negative for fever and fatigue.  HENT: Negative for ear pain.   Eyes: Negative for pain.  Respiratory: Negative for shortness of breath.   Cardiovascular: Negative for chest pain.  Gastrointestinal: Negative for abdominal pain.       Objective:   Physical Exam  Constitutional: He is oriented to person, place, and time. Vital signs are normal. He appears well-developed and well-nourished.  Central obesity  HENT:  Head: Normocephalic.  Right Ear: Hearing normal.  Left Ear: Hearing normal.  Nose: Nose normal.   Mouth/Throat: Oropharynx is clear and moist and mucous membranes are normal.  Neck: Trachea normal. Carotid bruit is not present. No mass and no thyromegaly present.  Cardiovascular: Normal rate, regular rhythm and normal pulses.  Exam reveals no gallop, no distant heart sounds and no friction rub.   No murmur heard. No peripheral edema  Pulmonary/Chest: Effort normal and breath sounds normal. No respiratory distress.  Abdominal: Soft. There is no tenderness.  Neurological: He is alert and oriented to person, place, and time.  Skin: Skin is warm, dry and intact. No rash noted.  Healing hyperpigmentation in left posterior knee  Psychiatric: He has a normal mood and affect. His speech is normal and behavior is normal. Thought content normal.        Assessment & Plan:

## 2012-10-27 NOTE — Assessment & Plan Note (Signed)
Well controlled. Continue current medication.  

## 2012-10-27 NOTE — Patient Instructions (Addendum)
Don't limit salt in diet. Drink water but don't overdo it. Keep up great work on weight loss , exercise and healthy eating.  Return for labs pnly in 2 weeks.

## 2012-11-14 ENCOUNTER — Other Ambulatory Visit: Payer: Medicare Other

## 2012-12-01 ENCOUNTER — Telehealth: Payer: Self-pay | Admitting: Family Medicine

## 2012-12-01 ENCOUNTER — Other Ambulatory Visit (INDEPENDENT_AMBULATORY_CARE_PROVIDER_SITE_OTHER): Payer: Medicare Other

## 2012-12-01 DIAGNOSIS — E871 Hypo-osmolality and hyponatremia: Secondary | ICD-10-CM

## 2012-12-01 LAB — BASIC METABOLIC PANEL
CO2: 27 mEq/L (ref 19–32)
Calcium: 9.4 mg/dL (ref 8.4–10.5)
Chloride: 95 mEq/L — ABNORMAL LOW (ref 96–112)
Potassium: 4 mEq/L (ref 3.5–5.1)
Sodium: 127 mEq/L — ABNORMAL LOW (ref 135–145)

## 2012-12-01 MED ORDER — LISINOPRIL 10 MG PO TABS: 10.0000 mg | ORAL_TABLET | Freq: Every day | ORAL | Status: DC

## 2012-12-01 NOTE — Telephone Encounter (Signed)
Notify pt that sodium remains low.  I recommend changing lisinopril HCTZ to lisinopril plain. i sent in rx to pharmacy.  Follow BPs at home/at pharm.  MAke follow up in 2 weeks for BP check with me with labs prior for repeat BMET.

## 2012-12-04 NOTE — Telephone Encounter (Signed)
Pt left v/m requesting cb. 

## 2012-12-04 NOTE — Telephone Encounter (Signed)
Left message for Patient to return my call 

## 2012-12-04 NOTE — Telephone Encounter (Signed)
Left message for Timothy to return my call

## 2012-12-05 NOTE — Telephone Encounter (Signed)
Patient notified as instructed by telephone. Appointments scheduled. 

## 2012-12-05 NOTE — Telephone Encounter (Signed)
Pt left v/m requesting cb [phone].

## 2012-12-18 ENCOUNTER — Other Ambulatory Visit (INDEPENDENT_AMBULATORY_CARE_PROVIDER_SITE_OTHER): Payer: Medicare Other

## 2012-12-18 DIAGNOSIS — E871 Hypo-osmolality and hyponatremia: Secondary | ICD-10-CM

## 2012-12-18 LAB — BASIC METABOLIC PANEL
BUN: 7 mg/dL (ref 6–23)
CO2: 27 mEq/L (ref 19–32)
Calcium: 9.3 mg/dL (ref 8.4–10.5)
Chloride: 97 mEq/L (ref 96–112)
Creatinine, Ser: 1.2 mg/dL (ref 0.4–1.5)

## 2012-12-19 ENCOUNTER — Ambulatory Visit: Payer: Medicare Other | Admitting: Family Medicine

## 2012-12-19 ENCOUNTER — Telehealth: Payer: Self-pay | Admitting: Family Medicine

## 2012-12-19 NOTE — Telephone Encounter (Signed)
Notify pt that sodium is better but remains somewhat low despite stopping HCTZ. Ask him how his BPs are running. If unable to check have him make a BP check appt with me.

## 2012-12-19 NOTE — Telephone Encounter (Signed)
No thanks you.

## 2012-12-19 NOTE — Telephone Encounter (Signed)
Patient already has an appointment scheduled to see you Thursday 12/21/2012.  Do you still want me to call him?

## 2012-12-19 NOTE — Telephone Encounter (Signed)
No that is okay

## 2012-12-21 ENCOUNTER — Encounter: Payer: Self-pay | Admitting: Family Medicine

## 2012-12-21 ENCOUNTER — Ambulatory Visit (INDEPENDENT_AMBULATORY_CARE_PROVIDER_SITE_OTHER): Payer: Medicare Other | Admitting: Family Medicine

## 2012-12-21 VITALS — BP 120/64 | HR 84 | Temp 98.4°F | Ht 69.0 in | Wt 222.8 lb

## 2012-12-21 DIAGNOSIS — I10 Essential (primary) hypertension: Secondary | ICD-10-CM

## 2012-12-21 DIAGNOSIS — E871 Hypo-osmolality and hyponatremia: Secondary | ICD-10-CM

## 2012-12-21 NOTE — Progress Notes (Signed)
  Subjective:    Patient ID: Carl Contreras, male    DOB: September 21, 1955, 57 y.o.   MRN: [id number]  HPI 57 year old male here for follow up.  He was changed to lisinopril alone for BP given low sodium. Sodium has improved some but still not in nml range.  BP today remains well controlled.  he feels better overall.   Review of Systems  Constitutional: Negative for fatigue.  HENT: Negative for ear pain.   Eyes: Negative for pain.  Respiratory: Negative for shortness of breath.   Cardiovascular: Negative for chest pain.       Objective:   Physical Exam  Constitutional: Vital signs are normal. He appears well-developed and well-nourished.  HENT:  Head: Normocephalic.  Right Ear: Hearing normal.  Left Ear: Hearing normal.  Nose: Nose normal.  Mouth/Throat: Oropharynx is clear and moist and mucous membranes are normal.  Neck: Trachea normal. Carotid bruit is not present. No mass and no thyromegaly present.  Cardiovascular: Normal rate, regular rhythm and normal pulses.  Exam reveals no gallop, no distant heart sounds and no friction rub.   No murmur heard. No peripheral edema  Pulmonary/Chest: Effort normal and breath sounds normal. No respiratory distress.  Skin: Skin is warm, dry and intact. No rash noted.  Psychiatric: He has a normal mood and affect. His speech is normal and behavior is normal. Thought content normal.          Assessment & Plan:

## 2012-12-21 NOTE — Patient Instructions (Addendum)
Continue lisinopril without the HCTZ. No salt restriction. Schedule  medicare wellness in next 3 months with labs prior.

## 2012-12-21 NOTE — Assessment & Plan Note (Signed)
Likely will continue to trend up off HCTZ.

## 2012-12-21 NOTE — Assessment & Plan Note (Signed)
Well controlled. Continue current medication.  

## 2013-02-08 ENCOUNTER — Other Ambulatory Visit: Payer: Self-pay | Admitting: *Deleted

## 2013-02-08 MED ORDER — LISINOPRIL 10 MG PO TABS: 10.0000 mg | ORAL_TABLET | Freq: Every day | ORAL | Status: DC

## 2013-02-08 MED ORDER — SIMVASTATIN 40 MG PO TABS: ORAL_TABLET | ORAL | Status: DC

## 2013-02-14 ENCOUNTER — Other Ambulatory Visit: Payer: Self-pay | Admitting: *Deleted

## 2013-02-14 MED ORDER — LISINOPRIL 10 MG PO TABS: 10.0000 mg | ORAL_TABLET | Freq: Every day | ORAL | Status: DC

## 2013-02-14 MED ORDER — SIMVASTATIN 40 MG PO TABS: ORAL_TABLET | ORAL | Status: DC

## 2013-03-14 ENCOUNTER — Telehealth: Payer: Self-pay | Admitting: Pulmonary Disease

## 2013-03-14 NOTE — Telephone Encounter (Signed)
Pt last seen 04/08/10. He scheduled an appt to come in and see [facility] tomorrow. Nothing further needed

## 2013-03-15 ENCOUNTER — Encounter: Payer: Self-pay | Admitting: Pulmonary Disease

## 2013-03-15 ENCOUNTER — Ambulatory Visit (INDEPENDENT_AMBULATORY_CARE_PROVIDER_SITE_OTHER): Payer: Medicare Other | Admitting: Pulmonary Disease

## 2013-03-15 VITALS — BP 130/86 | HR 60 | Temp 98.0°F | Ht 69.0 in | Wt 225.2 lb

## 2013-03-15 DIAGNOSIS — G2581 Restless legs syndrome: Secondary | ICD-10-CM

## 2013-03-15 DIAGNOSIS — G4733 Obstructive sleep apnea (adult) (pediatric): Secondary | ICD-10-CM

## 2013-03-15 NOTE — Patient Instructions (Signed)
Continue with cpap, and change your cushions on your mask every 3 months Keep working on weight loss If your leg movements are getting worse, let us know and we can restart your medication.  followup with me in one year.

## 2013-03-15 NOTE — Progress Notes (Signed)
   Subjective:    Patient ID: Carl Contreras, male    DOB: [birth year], 56 y.o.   MRN: [id number]  HPI Patient comes in today for followup of his obstructive sleep apnea and also history of restless leg syndrome. He has stopped using his dopamine agonist, and although he does have some symptoms during the day, he does not feel this is significantly interrupting his sleep. He is wearing CPAP compliantly, and is overdue for a new mask. He is having no issues with his pressure, and feels that he sleeps well. His weight is down from the last visit.   Review of Systems  Constitutional: Negative for fever and unexpected weight change.  HENT: Positive for congestion. Negative for dental problem, ear pain, nosebleeds, postnasal drip, rhinorrhea, sinus pressure, sneezing, sore throat and trouble swallowing.   Eyes: Negative for redness and itching.  Respiratory: Negative for cough, chest tightness, shortness of breath and wheezing.   Cardiovascular: Negative for palpitations and leg swelling.  Gastrointestinal: Negative for nausea and vomiting.  Genitourinary: Negative for dysuria.  Musculoskeletal: Negative for joint swelling.  Skin: Negative for rash.  Neurological: Negative for headaches.  Hematological: Does not bruise/bleed easily.  Psychiatric/Behavioral: Negative for dysphoric mood. The patient is not nervous/anxious.        Objective:   Physical Exam Obese male in no acute distress Nose without purulence or discharge noted No skin breakdown or pressure necrosis from the CPAP mask Neck without lymphadenopathy or thyromegaly Lower extremities without edema, no cyanosis Alert, does not appear to be sleepy, moves all 4 extremities.       Assessment & Plan:

## 2013-03-15 NOTE — Assessment & Plan Note (Signed)
The patient has discontinued his dopamine agonist, and does not feel that his leg kicks are interfering with his quality of life at present.

## 2013-03-15 NOTE — Assessment & Plan Note (Signed)
The patient is wearing CPAP compliantly, and is having no significant issues with his mask or pressure. He is overdue for a new mask, and the order has been sent. I've encouraged him to work aggressively on weight loss, and to followup with me again in one year.

## 2013-11-28 ENCOUNTER — Other Ambulatory Visit: Payer: Self-pay | Admitting: *Deleted

## 2013-11-28 MED ORDER — SIMVASTATIN 40 MG PO TABS: ORAL_TABLET | ORAL | Status: DC

## 2014-02-26 ENCOUNTER — Other Ambulatory Visit: Payer: Self-pay | Admitting: Family Medicine

## 2014-03-14 ENCOUNTER — Ambulatory Visit (INDEPENDENT_AMBULATORY_CARE_PROVIDER_SITE_OTHER): Payer: Medicare Other | Admitting: Pulmonary Disease

## 2014-03-14 ENCOUNTER — Encounter: Payer: Self-pay | Admitting: Pulmonary Disease

## 2014-03-14 VITALS — BP 134/78 | HR 65 | Temp 97.8°F | Ht 69.0 in | Wt 224.6 lb

## 2014-03-14 DIAGNOSIS — G4733 Obstructive sleep apnea (adult) (pediatric): Secondary | ICD-10-CM

## 2014-03-14 NOTE — Assessment & Plan Note (Signed)
The patient continues to wear his C Pap compliantly, and feels that he sleeps well with the device. He is having ongoing mask fit issues, I think he would benefit from a fitting session at the sleep Center. I have encouraged him to keep up with his supplies, and to keep working on weight loss. I will see him back in one year if doing well.

## 2014-03-14 NOTE — Progress Notes (Signed)
   Subjective:    Patient ID: Carl Contreras, male    DOB: 1955-08-26, 58 y.o.   MRN: [id number]  HPI The patient comes in today for follow-up of his obstructive sleep apnea. He is wearing C Pap compliantly, and is having no issues with his pressure, but is having a lot of mask fit issues. Despite this, he feels that he sleeps fairly well with the device, and is satisfied with his daytime alertness. Of note, his weight is neutral from the last visit.   Review of Systems  Constitutional: Negative for fever and unexpected weight change.  HENT: Positive for rhinorrhea. Negative for congestion, dental problem, ear pain, nosebleeds, postnasal drip, sinus pressure, sneezing, sore throat and trouble swallowing.   Eyes: Negative for redness and itching.  Respiratory: Negative for cough, chest tightness, shortness of breath and wheezing.   Cardiovascular: Negative for palpitations and leg swelling.  Gastrointestinal: Negative for nausea and vomiting.  Genitourinary: Negative for dysuria.  Musculoskeletal: Negative for joint swelling.  Skin: Negative for rash.  Neurological: Negative for headaches.  Hematological: Does not bruise/bleed easily.  Psychiatric/Behavioral: Negative for dysphoric mood. The patient is not nervous/anxious.        Objective:   Physical Exam Overweight male in no acute distress Nose without purulence or discharge noted No skin breakdown or pressure necrosis from the C Pap mask Neck without lymphadenopathy or thyromegaly Lower extremities with mild edema, no cyanosis Alert and oriented, moves all 4 extremities.       Assessment & Plan:

## 2014-03-14 NOTE — Patient Instructions (Signed)
Continue on cpap, and keep up with supplies. Will get your referred to the sleep center for a mask fitting session Keep working on weight loss followup with me again in one year.

## 2014-03-15 ENCOUNTER — Ambulatory Visit: Payer: Medicare Other | Admitting: Pulmonary Disease

## 2014-04-09 ENCOUNTER — Ambulatory Visit (HOSPITAL_BASED_OUTPATIENT_CLINIC_OR_DEPARTMENT_OTHER): Payer: Medicaid Other | Attending: Pulmonary Disease | Admitting: Radiology

## 2014-04-09 DIAGNOSIS — Z9989 Dependence on other enabling machines and devices: Principal | ICD-10-CM

## 2014-04-09 DIAGNOSIS — G4733 Obstructive sleep apnea (adult) (pediatric): Secondary | ICD-10-CM

## 2014-04-25 ENCOUNTER — Telehealth: Payer: Self-pay | Admitting: Family Medicine

## 2014-04-25 ENCOUNTER — Other Ambulatory Visit (INDEPENDENT_AMBULATORY_CARE_PROVIDER_SITE_OTHER): Payer: Medicare Other

## 2014-04-25 DIAGNOSIS — Z125 Encounter for screening for malignant neoplasm of prostate: Secondary | ICD-10-CM

## 2014-04-25 DIAGNOSIS — E7800 Pure hypercholesterolemia, unspecified: Secondary | ICD-10-CM

## 2014-04-25 LAB — LIPID PANEL
Cholesterol: 119 mg/dL (ref 0–200)
HDL: 42.5 mg/dL (ref >39.00)
LDL CALC: 61 mg/dL (ref 0–99)
NONHDL: 76.5
Total CHOL/HDL Ratio: 3
Triglycerides: 78 mg/dL (ref 0.0–149.0)
VLDL: 15.6 mg/dL (ref 0.0–40.0)

## 2014-04-25 LAB — COMPREHENSIVE METABOLIC PANEL
ALT: 15 U/L (ref 0–53)
AST: 16 U/L (ref 0–37)
Albumin: 4.6 g/dL (ref 3.5–5.2)
Alkaline Phosphatase: 90 U/L (ref 39–117)
BILIRUBIN TOTAL: 0.5 mg/dL (ref 0.2–1.2)
BUN: 14 mg/dL (ref 6–23)
CALCIUM: 9.7 mg/dL (ref 8.4–10.5)
CO2: 26 meq/L (ref 19–32)
Chloride: 101 mEq/L (ref 96–112)
Creatinine, Ser: 1.17 mg/dL (ref 0.40–1.50)
GFR: 68.1 mL/min (ref >60.00)
GLUCOSE: 103 mg/dL — AB (ref 70–99)
POTASSIUM: 4.8 meq/L (ref 3.5–5.1)
SODIUM: 133 meq/L — AB (ref 135–145)
TOTAL PROTEIN: 6.9 g/dL (ref 6.0–8.3)

## 2014-04-25 LAB — PSA, MEDICARE: PSA: 0.71 ng/ml (ref 0.10–4.00)

## 2014-04-25 NOTE — Telephone Encounter (Signed)
-----   Message from Ellamae Sia sent at 04/23/2014  3:39 PM EST ----- Regarding: Lab orders for Thursday, 1.28.16 Patient is scheduled for CPX labs, please order future labs, Thanks , Karna Christmas

## 2014-04-29 ENCOUNTER — Encounter: Payer: Self-pay | Admitting: Family Medicine

## 2014-04-29 ENCOUNTER — Ambulatory Visit (INDEPENDENT_AMBULATORY_CARE_PROVIDER_SITE_OTHER): Payer: Medicare Other | Admitting: Family Medicine

## 2014-04-29 ENCOUNTER — Telehealth: Payer: Self-pay | Admitting: Family Medicine

## 2014-04-29 VITALS — BP 120/80 | HR 58 | Temp 97.5°F | Ht 68.5 in | Wt 219.2 lb

## 2014-04-29 DIAGNOSIS — E7800 Pure hypercholesterolemia, unspecified: Secondary | ICD-10-CM

## 2014-04-29 DIAGNOSIS — I10 Essential (primary) hypertension: Secondary | ICD-10-CM

## 2014-04-29 DIAGNOSIS — Z0000 Encounter for general adult medical examination without abnormal findings: Secondary | ICD-10-CM

## 2014-04-29 NOTE — Patient Instructions (Addendum)
Call  Dr.  Lenoard Aden [facility])  office to ask if you are due for a repeat colonoscopy and to set up this year.

## 2014-04-29 NOTE — Progress Notes (Signed)
Pre visit review using our clinic review tool, if applicable. No additional management support is needed unless otherwise documented below in the visit note. 

## 2014-04-29 NOTE — Progress Notes (Signed)
HPI  I have personally reviewed the Medicare Annual Wellness questionnaire and have noted 1.The patient's medical and social history 2.Their use of alcohol, tobacco or illicit drugs 3.Their current medications and supplements 4.The patient's functional ability including ADL's, fall risks, home safety risks and hearing or visual  impairment. 5.Diet and physical activities 6.Evidence for depression or mood disorders The patients weight, height, BMI and visual acuity have been recorded in the chart I have made referrals, counseling and provided education to the patient based review of the above and I have provided the pt with a written personalized care plan for preventive services.  His low back pain is resolved after chiropractor monthly..  Schizophrenia: stable per pt on fluphenazine geodon,  And cogentin .  Working better with mood and less drowsiness.  Hypertension: Well controlled on lisinopril/HCTZ. BP Readings from Last 3 Encounters:  04/29/14 120/80  03/14/14 134/78  03/15/13 130/86  Using medication without problems or lightheadedness: None Chest pain with exertion:None Edema:None Short of breath:None Average home BPs:not checking. Other issues:  Elevated Cholesterol: Great control on simvastain 40 mg daily..  Lab Results  Component Value Date   CHOL 119 04/25/2014   HDL 42.50 04/25/2014   LDLCALC 61 04/25/2014   LDLDIRECT 72.7 06/11/2009   TRIG 78.0 04/25/2014   CHOLHDL 3 04/25/2014  Using medications without problems: None Muscle aches: None Diet compliance:Moderate. Exercise: Walks some daily...20-30 min Other complaints:  Still smoking.Marland Kitchen Has cut back to 7-10 cigs a day. Has not liked nicotine patches or gum in past.  Review of Systems  Constitutional: Negative for fever, fatigue and unexpected weight change.  HENT: Negative for ear pain, congestion, sore throat, rhinorrhea, trouble  swallowing and postnasal drip.  Eyes: Negative for pain.  Respiratory: Negative for cough, shortness of breath and wheezing.  Cardiovascular: Negative for chest pain, palpitations and leg swelling.  Gastrointestinal: Negative for nausea, abdominal pain, diarrhea, constipation and blood in stool.  Genitourinary: Negative for dysuria, urgency, hematuria, discharge, penile swelling, scrotal swelling, difficulty urinating, penile pain and testicular pain.  Skin: Negative for rash.  Neurological: Negative for syncope, weakness, light-headedness, numbness and headaches.  Psychiatric/Behavioral: Negative for behavioral problems and dysphoric mood. The patient is not nervous/anxious.  NO SI, no HI      Objective:   Physical Exam  Constitutional: He appears well-developed and well-nourished. Non-toxic appearance. He does not appear ill. No distress.  HENT:  Head: Normocephalic and atraumatic.  Right Ear: Hearing, tympanic membrane, external ear and ear canal normal.  Left Ear: Hearing, tympanic membrane, external ear and ear canal normal.  Nose: Nose normal.  Mouth/Throat: Uvula is midline, oropharynx is clear and moist and mucous membranes are normal.  Eyes: Conjunctivae, EOM and lids are normal. Pupils are equal, round, and reactive to light. No foreign bodies found.  Neck: Trachea normal, normal range of motion and phonation normal. Neck supple. Carotid bruit is not present. No mass and no thyromegaly present.  Cardiovascular: Normal rate, regular rhythm, S1 normal, S2 normal, intact distal pulses and normal pulses. Exam reveals no gallop.  No murmur heard. Pulmonary/Chest: Breath sounds normal. He has no wheezes. He has no rhonchi. He has no rales.  Abdominal: Soft. Normal appearance and bowel sounds are normal. There is no hepatosplenomegaly. There is no tenderness. There is no rebound, no guarding and no CVA tenderness. No hernia. Hernia confirmed negative in the right inguinal  area and confirmed negative in the left inguinal area.  Genitourinary: Prostate normal, testes normal and penis normal.  Rectal exam shows no external hemorrhoid, no internal hemorrhoid, no fissure, no mass, no tenderness and anal tone normal. Guaiac negative stool. Prostate is not enlarged and not tender. Right testis shows no mass and no tenderness. Left testis shows no mass and no tenderness. No paraphimosis or penile tenderness.  Lymphadenopathy:   He has no cervical adenopathy.   Right: No inguinal adenopathy present.   Left: No inguinal adenopathy present.  Neurological: He is alert. He has normal strength and normal reflexes. No cranial nerve deficit or sensory deficit. Gait normal.  Skin: Skin is warm, dry and intact. No rash noted.  Psychiatric: He has a normal mood and affect. His speech is normal and behavior is normal. Judgment normal.   MMSE: 28/30 Good judgement, appropriate affect.        Assessment & Plan:  AMW: The patient's preventative maintenance and recommended screening tests for an annual wellness exam were reviewed in full today. Brought up to date unless services declined.  Counselled on the importance of diet, exercise, and its role in overall health and mortality. The patient's FH and SH was reviewed, including their home life, tobacco status, and drug and alcohol status.   Vaccine; refuses flu, uptodate with Tdap. Colon: 2011, Dr. Fuller Plan.. Recommended repeat in 3 years. Prostate:  perform prostate exam.  Lab Results  Component Value Date   PSA 0.71 04/25/2014   PSA 0.52 10/24/2012   PSA 0.62 06/17/2009

## 2014-04-29 NOTE — Telephone Encounter (Signed)
emmi mailed  °

## 2014-04-30 ENCOUNTER — Encounter: Payer: Self-pay | Admitting: Gastroenterology

## 2014-05-01 ENCOUNTER — Encounter: Payer: Self-pay | Admitting: Family Medicine

## 2014-05-14 NOTE — Assessment & Plan Note (Signed)
Well controlled. Continue current medication.  

## 2014-05-14 NOTE — Assessment & Plan Note (Signed)
Well controlled. Continue current medication. Encouraged exercise, weight loss, healthy eating habits.  

## 2014-05-21 ENCOUNTER — Encounter: Payer: Self-pay | Admitting: Gastroenterology

## 2014-05-25 ENCOUNTER — Other Ambulatory Visit: Payer: Self-pay | Admitting: Family Medicine

## 2014-06-06 ENCOUNTER — Ambulatory Visit (AMBULATORY_SURGERY_CENTER): Payer: Self-pay | Admitting: *Deleted

## 2014-06-06 VITALS — Ht 69.0 in | Wt 210.8 lb

## 2014-06-06 DIAGNOSIS — Z86010 Personal history of colonic polyps: Secondary | ICD-10-CM

## 2014-06-06 MED ORDER — MOVIPREP 100 G PO SOLR: ORAL | Status: DC

## 2014-06-06 NOTE — Progress Notes (Signed)
No egg or soy allergy  No anesthesia or intubation problems per pt  No diet medications taken  Registered in [place] to pt importance of having care partner for procedure- that we cannot do colonoscopy with sedation if he doesn't have a care partner.  Understanding voiced and pt states he will find someone to bring him and stay at [facility] for his procedure

## 2014-06-20 ENCOUNTER — Ambulatory Visit (AMBULATORY_SURGERY_CENTER): Payer: Medicare Other | Admitting: Gastroenterology

## 2014-06-20 ENCOUNTER — Encounter: Payer: Self-pay | Admitting: Gastroenterology

## 2014-06-20 VITALS — BP 146/60 | HR 66 | Temp 96.2°F | Resp 24 | Ht 69.0 in | Wt 210.0 lb

## 2014-06-20 DIAGNOSIS — K635 Polyp of colon: Secondary | ICD-10-CM | POA: Diagnosis not present

## 2014-06-20 DIAGNOSIS — D125 Benign neoplasm of sigmoid colon: Secondary | ICD-10-CM

## 2014-06-20 DIAGNOSIS — Z86010 Personal history of colonic polyps: Secondary | ICD-10-CM | POA: Diagnosis not present

## 2014-06-20 MED ORDER — SODIUM CHLORIDE 0.9 % IV SOLN: 500.0000 mL | INTRAVENOUS | Status: DC

## 2014-06-20 NOTE — Progress Notes (Signed)
To recovery, report to McCoy, RN, VSS 

## 2014-06-20 NOTE — Patient Instructions (Signed)
Discharge instructions given. Handout on polyps. Resume previous medications. YOU HAD AN ENDOSCOPIC PROCEDURE TODAY AT THE [facility]:   Refer to the procedure report that was given to you for any specific questions about what was found during the examination.  If the procedure report does not answer your questions, please call your gastroenterologist to clarify.  If you requested that your care partner not be given the details of your procedure findings, then the procedure report has been included in a sealed envelope for you to review at your convenience later.  YOU SHOULD EXPECT: Some feelings of bloating in the abdomen. Passage of more gas than usual.  Walking can help get rid of the air that was put into your GI tract during the procedure and reduce the bloating. If you had a lower endoscopy (such as a colonoscopy or flexible sigmoidoscopy) you may notice spotting of blood in your stool or on the toilet paper. If you underwent a bowel prep for your procedure, you may not have a normal bowel movement for a few days.  Please Note:  You might notice some irritation and congestion in your nose or some drainage.  This is from the oxygen used during your procedure.  There is no need for concern and it should clear up in a day or so.  SYMPTOMS TO REPORT IMMEDIATELY:   Following lower endoscopy (colonoscopy or flexible sigmoidoscopy):  Excessive amounts of blood in the stool  Significant tenderness or worsening of abdominal pains  Swelling of the abdomen that is new, acute  Fever of 100F or higher   For urgent or emergent issues, a gastroenterologist can be reached at any hour by calling [phone].   DIET: Your first meal following the procedure should be a small meal and then it is ok to progress to your normal diet. Heavy or fried foods are harder to digest and may make you feel nauseous or bloated.  Likewise, meals heavy in dairy and vegetables can increase bloating.  Drink  plenty of fluids but you should avoid alcoholic beverages for 24 hours.  ACTIVITY:  You should plan to take it easy for the rest of today and you should NOT DRIVE or use heavy machinery until tomorrow (because of the sedation medicines used during the test).    FOLLOW UP: Our staff will call the number listed on your records the next business day following your procedure to check on you and address any questions or concerns that you may have regarding the information given to you following your procedure. If we do not reach you, we will leave a message.  However, if you are feeling well and you are not experiencing any problems, there is no need to return our call.  We will assume that you have returned to your regular daily activities without incident.  If any biopsies were taken you will be contacted by phone or by letter within the next 1-3 weeks.  Please call us at [phone] if you have not heard about the biopsies in 3 weeks.    SIGNATURES/CONFIDENTIALITY: You and/or your care partner have signed paperwork which will be entered into your electronic medical record.  These signatures attest to the fact that that the information above on your After Visit Summary has been reviewed and is understood.  Full responsibility of the confidentiality of this discharge information lies with you and/or your care-partner. 

## 2014-06-20 NOTE — Op Note (Signed)
[facility]  [address] [place], [address]   COLONOSCOPY PROCEDURE REPORT  PATIENT: Carl, Contreras  MR#: #[id number] BIRTHDATE: 10-06-1955 , 9  yrs. old GENDER: male ENDOSCOPIST: Ladene Artist, MD, [facility] PROCEDURE DATE:  06/20/2014 PROCEDURE:   Colonoscopy, surveillance First Screening Colonoscopy - Avg.  risk and is 50 yrs.  old or older - No.  Prior Negative Screening - Now for repeat screening. N/A  History of Adenoma - Now for follow-up colonoscopy & has been > or = to 3 yrs.  Yes hx of adenoma.  Has been 3 or more years since last colonoscopy. ASA CLASS:   Class II INDICATIONS:Surveillance due to prior colonic neoplasia and PH Colon Adenoma. MEDICATIONS: Monitored anesthesia care and Propofol 300 mg IV DESCRIPTION OF PROCEDURE:   After the risks benefits and alternatives of the procedure were thoroughly explained, informed consent was obtained.  The digital rectal exam revealed no abnormalities of the rectum.   The LB [id number]  endoscope was introduced through the anus and advanced to the cecum, which was identified by both the appendix and ileocecal valve. No adverse events experienced.   The quality of the prep was good.  (MoviPrep was used)  The instrument was then slowly withdrawn as the colon was fully examined.    COLON FINDINGS: Six sessile polyps measuring 4-5 mm in size were found in the sigmoid colon.  Polypectomies were performed with a cold snare. Polyps all completely removed. Four polyps retrieved. The examination was otherwise normal.  Retroflexed views revealed no abnormalities. The time to cecum = 1.7 Withdrawal time = 13.0 The scope was withdrawn and the procedure completed. COMPLICATIONS: There were no immediate complications.  ENDOSCOPIC IMPRESSION: 1.   Six sessile polyps in the sigmoid colon; polypectomies performed with a cold snare 2.   The examination was otherwise normal  RECOMMENDATIONS: 1.  Await pathology  results 2.  Repeat Colonoscopy in 5 years.  eSigned:  Ladene Artist, MD, [facility] 06/20/2014 8:57 AM

## 2014-06-20 NOTE — Progress Notes (Signed)
Called to room to assist during endoscopic procedure.  Patient ID and intended procedure confirmed with present staff. Received instructions for my participation in the procedure from the performing physician.  

## 2014-06-24 ENCOUNTER — Telehealth: Payer: Self-pay | Admitting: *Deleted

## 2014-06-24 NOTE — Telephone Encounter (Signed)
Left message on f/u call 

## 2014-06-28 ENCOUNTER — Encounter: Payer: Self-pay | Admitting: Gastroenterology

## 2014-09-04 ENCOUNTER — Encounter: Payer: Self-pay | Admitting: Gastroenterology

## 2014-09-17 ENCOUNTER — Encounter: Payer: Self-pay | Admitting: Internal Medicine

## 2014-09-24 ENCOUNTER — Telehealth: Payer: Self-pay | Admitting: Pulmonary Disease

## 2014-09-24 NOTE — Telephone Encounter (Signed)
Patient ordered mask through [facility] a month ago and has not received the mask yet. Patient has not had a mask for 4 days, the rubber on the mask came unglued and he cannot wear it. Advised patient that I would contact [employer] and get them to send him a mask today.  Message sent to [facility] to contact patient. Nothing further needed.

## 2015-03-17 ENCOUNTER — Ambulatory Visit: Payer: Medicaid Other | Admitting: Pulmonary Disease

## 2015-04-15 ENCOUNTER — Encounter: Payer: Self-pay | Admitting: Pulmonary Disease

## 2015-04-15 ENCOUNTER — Ambulatory Visit (INDEPENDENT_AMBULATORY_CARE_PROVIDER_SITE_OTHER): Payer: Medicare Other | Admitting: Pulmonary Disease

## 2015-04-15 VITALS — BP 118/68 | HR 57 | Ht 69.0 in | Wt 209.0 lb

## 2015-04-15 DIAGNOSIS — F17200 Nicotine dependence, unspecified, uncomplicated: Secondary | ICD-10-CM | POA: Diagnosis not present

## 2015-04-15 DIAGNOSIS — G4733 Obstructive sleep apnea (adult) (pediatric): Secondary | ICD-10-CM

## 2015-04-15 NOTE — Progress Notes (Signed)
   Subjective:    Patient ID: Carl Contreras, male    DOB: 1955-10-30, 60 y.o.   MRN: [id number]  HPI  Ayvan, paranoid schizo since age 49, plays harmonica in a blues band for followup of obstructive sleep apnea and  restless leg syndrome  Chief Complaint  Patient presents with  . Follow-up    Former [place] pt. Pt states that he is wearing his CPAP nightly for about 8 hours. Pt reports no leaks or mask problems. Pt denies daytime somnolence. Pt would like to know if he will ever be able to sleep without a CPAP.    Lost 15 lbs  - from 224 to 209 lbs  Likes Geodon - less sleepy daytime More alert  Uses CPAP well but would like to get off this  - not good 'for girlfriends' No mask or pressure issues OK with DME supplies  Download - excellent usage, no residuals   Review of Systems neg for any significant sore throat, dysphagia, itching, sneezing, nasal congestion or excess/ purulent secretions, fever, chills, sweats, unintended wt loss, pleuritic or exertional cp, hempoptysis, orthopnea pnd or change in chronic leg swelling. Also denies presyncope, palpitations, heartburn, abdominal pain, nausea, vomiting, diarrhea or change in bowel or urinary habits, dysuria,hematuria, rash, arthralgias, visual complaints, headache, numbness weakness or ataxia.     Objective:   Physical Exam  Gen. Pleasant, well-nourished, in no distress ENT - no lesions, no post nasal drip Neck: No JVD, no thyromegaly, no carotid bruits Lungs: no use of accessory muscles, no dullness to percussion, clear without rales or rhonchi  Cardiovascular: Rhythm regular, heart sounds  normal, no murmurs or gallops, no peripheral edema Musculoskeletal: No deformities, no cyanosis or clubbing         Assessment & Plan:

## 2015-04-15 NOTE — Assessment & Plan Note (Signed)
Try to stop smoking - counselled

## 2015-04-15 NOTE — Patient Instructions (Signed)
CPAP supplies will be renewed x 1 year Try yo lose about 10 lbs this year Try to stop smoking

## 2015-04-15 NOTE — Assessment & Plan Note (Addendum)
CPAP supplies will be renewed x 1 year Try yo lose about 10 lbs this year   Weight loss encouraged, compliance with goal of at least 4-6 hrs every night is the expectation. Advised against medications with sedative side effects Cautioned against driving when sleepy - understanding that sleepiness will vary on a day to day basis

## 2015-04-24 ENCOUNTER — Encounter: Payer: Self-pay | Admitting: Pulmonary Disease

## 2015-05-30 ENCOUNTER — Other Ambulatory Visit: Payer: Self-pay | Admitting: Family Medicine

## 2015-05-30 NOTE — Telephone Encounter (Signed)
Please call and schedule CPE with fasting labs prior for Dr. Bedsole.  

## 2015-08-26 ENCOUNTER — Telehealth: Payer: Self-pay | Admitting: Family Medicine

## 2015-08-26 NOTE — Telephone Encounter (Signed)
Please call and schedule CPE with fasting labs prior for Dr. Diona Browner.  Once scheduled, please send back to me to refill his medications.

## 2015-08-26 NOTE — Telephone Encounter (Signed)
Last office visit 04/29/2014.  Refill?

## 2015-08-26 NOTE — Telephone Encounter (Signed)
Pt needs appt for CPX with labs prior. OKay to refill until  Date appt made

## 2015-08-26 NOTE — Telephone Encounter (Signed)
Patient scheduled physical on 10/30/15.

## 2015-08-26 NOTE — Telephone Encounter (Signed)
Left message asking pt to call office  °

## 2015-09-12 ENCOUNTER — Encounter: Payer: Medicare Other | Admitting: Family Medicine

## 2015-10-22 ENCOUNTER — Telehealth: Payer: Self-pay | Admitting: Family Medicine

## 2015-10-22 DIAGNOSIS — E7800 Pure hypercholesterolemia, unspecified: Secondary | ICD-10-CM

## 2015-10-22 NOTE — Telephone Encounter (Signed)
-----   Message from Ellamae Sia sent at 10/15/2015  3:16 PM EDT ----- Regarding: Lab orders for Thursday, 7.27.17 Patient is scheduled for CPX labs, please order future labs, Thanks , Karna Christmas

## 2015-10-23 ENCOUNTER — Other Ambulatory Visit (INDEPENDENT_AMBULATORY_CARE_PROVIDER_SITE_OTHER): Payer: Medicare Other

## 2015-10-23 DIAGNOSIS — E7800 Pure hypercholesterolemia, unspecified: Secondary | ICD-10-CM | POA: Diagnosis not present

## 2015-10-23 LAB — COMPREHENSIVE METABOLIC PANEL
ALBUMIN: 4.8 g/dL (ref 3.5–5.2)
ALT: 15 U/L (ref 0–53)
AST: 17 U/L (ref 0–37)
Alkaline Phosphatase: 86 U/L (ref 39–117)
BUN: 18 mg/dL (ref 6–23)
CALCIUM: 10.1 mg/dL (ref 8.4–10.5)
CHLORIDE: 97 meq/L (ref 96–112)
CO2: 27 meq/L (ref 19–32)
CREATININE: 1.24 mg/dL (ref 0.40–1.50)
GFR: 63.36 mL/min (ref >60.00)
Glucose, Bld: 94 mg/dL (ref 70–99)
POTASSIUM: 4.4 meq/L (ref 3.5–5.1)
Sodium: 131 mEq/L — ABNORMAL LOW (ref 135–145)
Total Bilirubin: 0.6 mg/dL (ref 0.2–1.2)
Total Protein: 7.2 g/dL (ref 6.0–8.3)

## 2015-10-23 LAB — LIPID PANEL
CHOL/HDL RATIO: 3
CHOLESTEROL: 125 mg/dL (ref 0–200)
HDL: 46.6 mg/dL (ref >39.00)
LDL CALC: 64 mg/dL (ref 0–99)
NonHDL: 78.3
TRIGLYCERIDES: 70 mg/dL (ref 0.0–149.0)
VLDL: 14 mg/dL (ref 0.0–40.0)

## 2015-10-23 LAB — PSA, MEDICARE: PSA: 0.99 ng/ml (ref 0.10–4.00)

## 2015-10-30 ENCOUNTER — Encounter: Payer: Self-pay | Admitting: Family Medicine

## 2015-10-30 ENCOUNTER — Ambulatory Visit (INDEPENDENT_AMBULATORY_CARE_PROVIDER_SITE_OTHER): Payer: Medicare Other | Admitting: Family Medicine

## 2015-10-30 VITALS — BP 104/70 | HR 56 | Temp 98.2°F | Ht 68.5 in | Wt 191.8 lb

## 2015-10-30 DIAGNOSIS — Z0000 Encounter for general adult medical examination without abnormal findings: Secondary | ICD-10-CM | POA: Diagnosis not present

## 2015-10-30 NOTE — Patient Instructions (Addendum)
Keep working on quitting smoking.  Keep up the great work with diet and exercise and weight loss.  Call if dizzy or if BP are running low < 90/60.Marland Kitchen We can decrease your medicine.

## 2015-10-30 NOTE — Progress Notes (Signed)
Pre visit review using our clinic review tool, if applicable. No additional management support is needed unless otherwise documented below in the visit note. 

## 2015-10-30 NOTE — Progress Notes (Signed)
I have personally reviewed the Medicare Annual Wellness questionnaire and have noted 1.The patient's medical and social history 2.Their use of alcohol, tobacco or illicit drugs 3.Their current medications and supplements 4.The patient's functional ability including ADL's, fall risks, home safety risks and hearing or visual  impairment. 5.Diet and physical activities 6.Evidence for depression or mood disorders The patients weight, height, BMI and visual acuity have been recorded in the chart I have made referrals, counseling and provided education to the patient based review of the above and I have provided the pt with a written personalized care plan for preventive services.  His low back pain is resolved after chiropractor monthly..  Schizophrenia: stable per pt on fluphenazine geodon,  And cogentin .  Working better with mood and less drowsiness.  Hypertension: Well controlled on lisinopril/HCTZ. BP Readings from Last 3 Encounters:  10/30/15 104/70  04/15/15 118/68  06/20/14 (!) 146/60  Using medication without problems or lightheadedness: None Chest pain with exertion:None Edema:None Short of breath:None Average home BPs:not checking. Other issues:  Elevated Cholesterol: Great control on simvastain 40 mg daily..  Lab Results  Component Value Date   CHOL 125 10/23/2015   HDL 46.60 10/23/2015   LDLCALC 64 10/23/2015   LDLDIRECT 72.7 06/11/2009   TRIG 70.0 10/23/2015   CHOLHDL 3 10/23/2015  Using medications without problems: None Muscle aches: None Diet compliance:Mod Exercise: Walks some daily...20-30 min Other complaints:   Wt Readings from Last 3 Encounters:  10/30/15 191 lb 12 oz (87 kg)  04/15/15 209 lb (94.8 kg)  06/20/14 210 lb (95.3 kg)   Body mass index is 28.73 kg/m.   Still smoking.Marland Kitchen Has cut back to 7-10 cigs a day. Has not liked nicotine patches or gum in past.  Social History  /Family History/Past Medical History reviewed and updated if needed.   Review of Systems  Constitutional: Negative for fever, fatigue and unexpected weight change.  HENT: Negative for ear pain, congestion, sore throat, rhinorrhea, trouble swallowing and postnasal drip.  Eyes: Negative for pain.  Respiratory: Negative for cough, shortness of breath and wheezing.  Cardiovascular: Negative for chest pain, palpitations and leg swelling.  Gastrointestinal: Negative for nausea, abdominal pain, diarrhea, constipation and blood in stool.  Genitourinary: Negative for dysuria, urgency, hematuria, discharge, penile swelling, scrotal swelling, difficulty urinating, penile pain and testicular pain.  Skin: Negative for rash.  Neurological: Negative for syncope, weakness, light-headedness, numbness and headaches.  Psychiatric/Behavioral: Negative for behavioral problems and dysphoric mood. The patient is not nervous/anxious.  NO SI, no HI      Objective:   Physical Exam  Constitutional: He appears well-developed and well-nourished. Non-toxic appearance. He does not appear ill. No distress.  HENT:  Head: Normocephalic and atraumatic.  Right Ear: Hearing, tympanic membrane, external ear and ear canal normal.  Left Ear: Hearing, tympanic membrane, external ear and ear canal normal.  Nose: Nose normal.  Mouth/Throat: Uvula is midline, oropharynx is clear and moist and mucous membranes are normal.  Eyes: Conjunctivae, EOM and lids are normal. Pupils are equal, round, and reactive to light. No foreign bodies found.  Neck: Trachea normal, normal range of motion and phonation normal. Neck supple. Carotid bruit is not present. No mass and no thyromegaly present.  Cardiovascular: Normal rate, regular rhythm, S1 normal, S2 normal, intact distal pulses and normal pulses. Exam reveals no gallop.  No murmur heard. Pulmonary/Chest: Breath sounds normal. He has no wheezes. He has no rhonchi. He has no  rales.  Abdominal: Soft. Normal appearance and  bowel sounds are normal. There is no hepatosplenomegaly. There is no tenderness. There is no rebound, no guarding and no CVA tenderness. No hernia. Hernia confirmed negative in the right inguinal area and confirmed negative in the left inguinal area.  Genitourinary: Prostate normal, testes normal and penis normal. Rectal exam shows no external hemorrhoid, no internal hemorrhoid, no fissure, no mass, no tenderness and anal tone normal. Guaiac negative stool. Prostate is not enlarged and not tender. Right testis shows no mass and no tenderness. Left testis shows no mass and no tenderness. No paraphimosis or penile tenderness.  Lymphadenopathy:   He has no cervical adenopathy.   Right: No inguinal adenopathy present.   Left: No inguinal adenopathy present.  Neurological: He is alert. He has normal strength and normal reflexes. No cranial nerve deficit or sensory deficit. Gait normal.  Skin: Skin is warm, dry and intact. No rash noted.  Psychiatric: He has a normal mood and affect. His speech is normal and behavior is normal. Judgment normal.           Assessment & Plan:  AMW: The patient's preventative maintenance and recommended screening tests for an annual wellness exam were reviewed in full today. Brought up to date unless services declined.  Counselled on the importance of diet, exercise, and its role in overall health and mortality. The patient's FH and SH was reviewed, including their home life, tobacco status, and drug and alcohol status.   Vaccine:uptodate with Tdap. Colon: 2016, Dr. Fuller Plan.. Recommended repeat in 5 years. Prostate:  stable Lab Results  Component Value Date   PSA 0.99 10/23/2015   PSA 0.71 04/25/2014   PSA 0.52 10/24/2012  Hep C; due, but refused till next year. HIV: refused  smoker, precontemplative

## 2015-12-05 ENCOUNTER — Other Ambulatory Visit: Payer: Self-pay | Admitting: Family Medicine

## 2016-04-14 ENCOUNTER — Ambulatory Visit: Payer: Medicare Other | Admitting: Adult Health

## 2016-04-15 ENCOUNTER — Ambulatory Visit: Payer: Medicare Other | Admitting: Adult Health

## 2016-04-27 ENCOUNTER — Encounter: Payer: Self-pay | Admitting: Adult Health

## 2016-04-27 ENCOUNTER — Ambulatory Visit (INDEPENDENT_AMBULATORY_CARE_PROVIDER_SITE_OTHER): Payer: Medicare Other | Admitting: Adult Health

## 2016-04-27 DIAGNOSIS — E661 Drug-induced obesity: Secondary | ICD-10-CM | POA: Insufficient documentation

## 2016-04-27 DIAGNOSIS — E6609 Other obesity due to excess calories: Secondary | ICD-10-CM | POA: Insufficient documentation

## 2016-04-27 DIAGNOSIS — G4733 Obstructive sleep apnea (adult) (pediatric): Secondary | ICD-10-CM

## 2016-04-27 DIAGNOSIS — E66811 Obesity, class 1: Secondary | ICD-10-CM | POA: Insufficient documentation

## 2016-04-27 DIAGNOSIS — Z6831 Body mass index (BMI) 31.0-31.9, adult: Secondary | ICD-10-CM

## 2016-04-27 NOTE — Assessment & Plan Note (Signed)
Wt loss  

## 2016-04-27 NOTE — Progress Notes (Signed)
@Patient  ID: Carl Contreras, male    DOB: May 19, 1955, 61 y.o.   MRN: [id number]  Chief Complaint  Patient presents with  . Follow-up    OSA     Referring provider: Jinny Sanders, MD  HPI: 61 yo male followed for OSA on CPAP  PMH Paranoid Schizophrenia   04/27/2016 Follow up : OSA  Patient returns for one-year follow-up for sleep apnea. Patient says he is doing well on C Pap at bedtime. Patient says he wears every night for at least 6-7 hours. Patient feels rested. Says he occasionally wakes up in the night could not go back to sleep. Patient says his mask fits well with no leaks.. Unable to get download , Patient did not bring in his SD card today. Patient does continue to smoke. Smoking cessation was discussed.   Allergies  Allergen Reactions  . Varenicline Tartrate     REACTION: nausea and vomiting    Immunization History  Administered Date(s) Administered  . Td 03/29/1998, 06/17/2009    Past Medical History:  Diagnosis Date  . Allergic rhinitis   . Allergy   . Arthritis   . Chronic kidney disease    kidney stone  . Hyperlipidemia   . Hypertension   . Schizophrenic disorder ([place])    paranoid schizophrenic  . Sleep apnea    wears CPAP    Tobacco History: History  Smoking Status  . Current Every Day Smoker  . Packs/day: 0.50  . Types: Cigarettes  . Start date: 03/29/1976  Smokeless Tobacco  . Former User   Ready to quit: No Counseling given: Yes   Outpatient Encounter Prescriptions as of 04/27/2016  Medication Sig  . benztropine (COGENTIN) 1 MG tablet Take 1 mg by mouth daily.  . fluPHENAZine (PROLIXIN) 5 MG tablet Take 5 mg by mouth daily.   Marland Kitchen lisinopril (PRINIVIL,ZESTRIL) 10 MG tablet TAKE 1 TABLET BY MOUTH DAILY  . loratadine (CLARITIN) 10 MG tablet Take 10 mg by mouth daily.    . Multiple Vitamin (MULTIVITAMIN) tablet Take 1 tablet by mouth daily.  . Omega-3 Fatty Acids (OMEGA-3 FISH OIL) 1000 MG CAPS Take by mouth. Take 2 tablet 2 times daily    . simvastatin (ZOCOR) 40 MG tablet TAKE ONE TABLET BY MOUTH EVERY NIGHT AT BEDTIME  . zinc gluconate 50 MG tablet Take 50 mg by mouth daily.   . ziprasidone (GEODON) 60 MG capsule Take 60 mg by mouth daily.   No facility-administered encounter medications on file as of 04/27/2016.      Review of Systems  Constitutional:   No  weight loss, night sweats,  Fevers, chills, fatigue, or  lassitude.  HEENT:   No headaches,  Difficulty swallowing,  Tooth/dental problems, or  Sore throat,                No sneezing, itching, ear ache, nasal congestion, post nasal drip,   CV:  No chest pain,  Orthopnea, PND, swelling in lower extremities, anasarca, dizziness, palpitations, syncope.   GI  No heartburn, indigestion, abdominal pain, nausea, vomiting, diarrhea, change in bowel habits, loss of appetite, bloody stools.   Resp: No shortness of breath with exertion or at rest.  No excess mucus, no productive cough,  No non-productive cough,  No coughing up of blood.  No change in color of mucus.  No wheezing.  No chest wall deformity  Skin: no rash or lesions.    Physical Exam  BP 130/70   Pulse 68  Temp 97 F (36.1 C) (Oral)   Ht 5\' 9"  (1.753 m)   SpO2 99%   GEN: A/Ox3; pleasant , NAD, obese    HEENT:  [place]/AT,    NOSE-clear, THROAT-clear, no lesions, no postnasal drip or exudate noted.  Class 2-3 MP airway   NECK:  Supple  ; no JVD;  no lymphadenopathy.    RESP  Clear  P & A; w/o, wheezes/ rales/ or rhonchi. no accessory muscle use, no dullness to percussion  CARD:  RRR, no m/r/g, no peripheral edema, pulses intact, no cyanosis or clubbing.  GI:   Soft & nt; nml bowel sounds; no organomegaly or masses detected.   Musco: Warm bil, no deformities or joint swelling noted.   Neuro: alert, no focal deficits noted.    Skin: Warm, no lesions or rashes   Lab Results:   Assessment & Plan:   Obstructive sleep apnea Well controlled with reported good compliance  Unable to get download  .   Plan  Patient Instructions  Continue on CPAP At bedtime   Wear each night  Work on weight loss .  Follow up Dr. Elsworth Soho  In 1 year and As needed   Work on not smoking .      Morbidly obese ([place]) Wt loss      Rexene Edison, NP 04/27/2016

## 2016-04-27 NOTE — Assessment & Plan Note (Signed)
Well controlled with reported good compliance  Unable to get download .   Plan  Patient Instructions  Continue on CPAP At bedtime   Wear each night  Work on weight loss .  Follow up Dr. Elsworth Soho  In 1 year and As needed   Work on not smoking .

## 2016-04-27 NOTE — Patient Instructions (Signed)
Continue on CPAP At bedtime   Wear each night  Work on weight loss .  Follow up Dr. Elsworth Soho  In 1 year and As needed   Work on not smoking .

## 2016-04-30 NOTE — Progress Notes (Signed)
Reviewed & agree with plan  

## 2016-12-08 ENCOUNTER — Other Ambulatory Visit: Payer: Self-pay | Admitting: Family Medicine

## 2017-01-10 ENCOUNTER — Other Ambulatory Visit: Payer: Self-pay | Admitting: Family Medicine

## 2017-01-24 ENCOUNTER — Other Ambulatory Visit: Payer: Self-pay | Admitting: Family Medicine

## 2017-01-25 ENCOUNTER — Telehealth: Payer: Self-pay | Admitting: Family Medicine

## 2017-01-25 ENCOUNTER — Other Ambulatory Visit (INDEPENDENT_AMBULATORY_CARE_PROVIDER_SITE_OTHER): Payer: Medicare Other

## 2017-01-25 DIAGNOSIS — E7800 Pure hypercholesterolemia, unspecified: Secondary | ICD-10-CM

## 2017-01-25 DIAGNOSIS — Z125 Encounter for screening for malignant neoplasm of prostate: Secondary | ICD-10-CM

## 2017-01-25 LAB — COMPREHENSIVE METABOLIC PANEL
ALBUMIN: 4.5 g/dL (ref 3.5–5.2)
ALT: 16 U/L (ref 0–53)
AST: 18 U/L (ref 0–37)
Alkaline Phosphatase: 86 U/L (ref 39–117)
BILIRUBIN TOTAL: 0.5 mg/dL (ref 0.2–1.2)
BUN: 25 mg/dL — AB (ref 6–23)
CHLORIDE: 100 meq/L (ref 96–112)
CO2: 28 mEq/L (ref 19–32)
CREATININE: 1.45 mg/dL (ref 0.40–1.50)
Calcium: 10.3 mg/dL (ref 8.4–10.5)
GFR: 52.67 mL/min — ABNORMAL LOW (ref >60.00)
Glucose, Bld: 105 mg/dL — ABNORMAL HIGH (ref 70–99)
Potassium: 5 mEq/L (ref 3.5–5.1)
Sodium: 135 mEq/L (ref 135–145)
Total Protein: 6.9 g/dL (ref 6.0–8.3)

## 2017-01-25 LAB — LIPID PANEL
CHOL/HDL RATIO: 3
CHOLESTEROL: 139 mg/dL (ref 0–200)
HDL: 52 mg/dL (ref >39.00)
LDL CALC: 66 mg/dL (ref 0–99)
NonHDL: 86.74
Triglycerides: 106 mg/dL (ref 0.0–149.0)
VLDL: 21.2 mg/dL (ref 0.0–40.0)

## 2017-01-25 LAB — PSA, MEDICARE: PSA: 1.24 ng/ml (ref 0.10–4.00)

## 2017-01-25 NOTE — Telephone Encounter (Signed)
-----   Message from Ellamae Sia sent at 01/24/2017  2:39 PM EDT ----- Regarding: Lab orders for Tuesday, 10.30.18 Patient is scheduled for CPX labs, please order future labs, Thanks , Karna Christmas

## 2017-01-27 ENCOUNTER — Ambulatory Visit (INDEPENDENT_AMBULATORY_CARE_PROVIDER_SITE_OTHER): Payer: Medicare Other | Admitting: Family Medicine

## 2017-01-27 ENCOUNTER — Encounter: Payer: Self-pay | Admitting: Family Medicine

## 2017-01-27 VITALS — BP 120/70 | HR 75 | Temp 98.5°F | Ht 68.5 in | Wt 207.2 lb

## 2017-01-27 DIAGNOSIS — F209 Schizophrenia, unspecified: Secondary | ICD-10-CM

## 2017-01-27 DIAGNOSIS — F17200 Nicotine dependence, unspecified, uncomplicated: Secondary | ICD-10-CM

## 2017-01-27 DIAGNOSIS — Z6831 Body mass index (BMI) 31.0-31.9, adult: Secondary | ICD-10-CM

## 2017-01-27 DIAGNOSIS — Z1159 Encounter for screening for other viral diseases: Secondary | ICD-10-CM | POA: Diagnosis not present

## 2017-01-27 DIAGNOSIS — Z0000 Encounter for general adult medical examination without abnormal findings: Secondary | ICD-10-CM | POA: Diagnosis not present

## 2017-01-27 DIAGNOSIS — E7800 Pure hypercholesterolemia, unspecified: Secondary | ICD-10-CM

## 2017-01-27 DIAGNOSIS — E6609 Other obesity due to excess calories: Secondary | ICD-10-CM

## 2017-01-27 DIAGNOSIS — I10 Essential (primary) hypertension: Secondary | ICD-10-CM | POA: Diagnosis not present

## 2017-01-27 NOTE — Assessment & Plan Note (Signed)
Well controlled. Continue current medication.  

## 2017-01-27 NOTE — Progress Notes (Signed)
Subjective:    Patient ID: Carl Contreras, male    DOB: May 25, 1955, 61 y.o.   MRN: [id number]  HPI The patient presents for annual medicare wellness, complete physical and review of chronic health problems.  No acute issues.  I have personally reviewed the Medicare Annual Wellness questionnaire and have noted 1. The patient's medical and social history 2. Their use of alcohol, tobacco or illicit drugs 3. Their current medications and supplements 4. The patient's functional ability including ADL's, fall risks, home safety risks and hearing or visual             impairment. 5. Diet and physical activities 6. Evidence for depression or mood disorders 7.         Updated provider list Cognitive evaluation was performed and recorded on pt medicare questionnaire form. The patients weight, height, BMI and visual acuity have been recorded in the chart  I have made referrals, counseling and provided education to the patient based review of the above and I have provided the pt with a written personalized care plan for preventive services.   Documentation of this information was scanned into the electronic record under the media tab.  Hypertension:   good control on lisinopril  Using medication without problems or lightheadedness:  none Chest pain with exertion: none Edema: none Short of breath: none Average home BPs: not checking Other issues:  Elevated Cholesterol:  LDL at goal  On simvastatin Lab Results  Component Value Date   CHOL 139 01/25/2017   HDL 52.00 01/25/2017   LDLCALC 66 01/25/2017   LDLDIRECT 72.7 06/11/2009   TRIG 106.0 01/25/2017   CHOLHDL 3 01/25/2017  Using medications without problems: Muscle aches:  Diet compliance: moderate Exercise: walking somewhat Other complaints:  Schizophrenia: Followed by psychiatry on Geodone, cogentin and prolixin.   No SOB,  No cough. Smokes half pack a day.   No chest pain.Social History /Family History/Past Medical History  reviewed in detail and updated in EMR if needed. Blood pressure 120/70, pulse 75, temperature 98.5 F (36.9 C), temperature source Oral, height 5' 8.5" (1.74 m), weight 207 lb 4 oz (94 kg), SpO2 98 %.   Advance directives and end of life planning reviewed in detail with patient and documented in EMR. Patient given handout on advance care directives if needed. HCPOA and living will updated if needed. Depression screen [facility] 2/9 01/27/2017 10/30/2015  Decreased Interest 0 0  Down, Depressed, Hopeless 0 0  PHQ - 2 Score 0 0    Hearing Screening   Method: Audiometry   125Hz  250Hz  500Hz  1000Hz  2000Hz  3000Hz  4000Hz  6000Hz  8000Hz   Right ear:   40 25 20  0    Left ear:   40 25 20  0      Visual Acuity Screening   Right eye Left eye Both eyes  Without correction: 20/40 20/30 20/25   With correction:      Fall Risk  01/27/2017 10/30/2015  Falls in the past year? No No   Review of Systems  Constitutional: Negative for fatigue and fever.  HENT: Negative for ear pain.   Eyes: Negative for pain.  Respiratory: Negative for cough and shortness of breath.   Cardiovascular: Negative for chest pain, palpitations and leg swelling.  Gastrointestinal: Negative for abdominal pain.  Genitourinary: Negative for dysuria.  Musculoskeletal: Negative for arthralgias.  Neurological: Negative for syncope, light-headedness and headaches.  Psychiatric/Behavioral: Negative for dysphoric mood.       Objective:   Physical Exam  Constitutional: He appears well-developed and well-nourished.  Non-toxic appearance. He does not appear ill. No distress.  Central obese  HENT:  Head: Normocephalic and atraumatic.  Right Ear: Hearing, tympanic membrane, external ear and ear canal normal.  Left Ear: Hearing, tympanic membrane, external ear and ear canal normal.  Nose: Nose normal.  Mouth/Throat: Uvula is midline, oropharynx is clear and moist and mucous membranes are normal.  Eyes: Pupils are equal, round, and reactive to  light. Conjunctivae, EOM and lids are normal. Lids are everted and swept, no foreign bodies found.  Neck: Trachea normal, normal range of motion and phonation normal. Neck supple. Carotid bruit is not present. No thyroid mass and no thyromegaly present.  Cardiovascular: Normal rate, regular rhythm, S1 normal, S2 normal, intact distal pulses and normal pulses.  Exam reveals no gallop.   No murmur heard. Pulmonary/Chest: Breath sounds normal. He has no wheezes. He has no rhonchi. He has no rales.  Abdominal: Soft. Normal appearance and bowel sounds are normal. There is no hepatosplenomegaly. There is no tenderness. There is no rebound, no guarding and no CVA tenderness. No hernia.  Lymphadenopathy:    He has no cervical adenopathy.  Neurological: He is alert. He has normal strength and normal reflexes. No cranial nerve deficit or sensory deficit. Gait normal.  Skin: Skin is warm, dry and intact. No rash noted.  Psychiatric: He has a normal mood and affect. His speech is normal and behavior is normal. Judgment normal.          Assessment & Plan:  The patient's preventative maintenance and recommended screening tests for an annual wellness exam were reviewed in full today. Brought up to date unless services declined.  Counselled on the importance of diet, exercise, and its role in overall health and mortality. The patient's FH and SH was reviewed, including their home life, tobacco status, and drug and alcohol status.    Vaccines: refused flu Prostate Cancer Screen: Nml PSA Colon Cancer Screen:  05/2014 dr. Alphonsus Sias, repeat in 5 years      Smoking Status: daily smoker.. precontemplative ETOH/ drug use: none/none  Hep C:  Due.. Will add on to recent labs.  HIV screen:  refused

## 2017-01-27 NOTE — Assessment & Plan Note (Signed)
Smoking cessation instruction/counseling given:  counseled patient on the dangers of tobacco use, advised patient to stop smoking, and reviewed strategies to maximize success 

## 2017-01-27 NOTE — Assessment & Plan Note (Addendum)
Body mass index is 31.05 kg/m.   Encouraged exercise, weight loss, healthy eating habits.

## 2017-01-27 NOTE — Patient Instructions (Addendum)
Increase walking and decrease sweets ie milkshakes.  Decrease cholesterol in diet.

## 2017-01-28 ENCOUNTER — Encounter: Payer: Self-pay | Admitting: *Deleted

## 2017-01-28 LAB — HEPATITIS C ANTIBODY
HEP C AB: NONREACTIVE
SIGNAL TO CUT-OFF: 0.01 (ref <1.00)

## 2017-02-14 ENCOUNTER — Other Ambulatory Visit: Payer: Self-pay | Admitting: Family Medicine

## 2017-03-31 ENCOUNTER — Telehealth: Payer: Self-pay | Admitting: Adult Health

## 2017-04-01 NOTE — Telephone Encounter (Signed)
No message attached, wil close encounter.

## 2017-09-08 ENCOUNTER — Other Ambulatory Visit: Payer: Self-pay | Admitting: Family Medicine

## 2017-12-13 ENCOUNTER — Other Ambulatory Visit: Payer: Self-pay | Admitting: Family Medicine

## 2018-01-10 ENCOUNTER — Other Ambulatory Visit: Payer: Self-pay | Admitting: Family Medicine

## 2018-04-14 ENCOUNTER — Other Ambulatory Visit: Payer: Self-pay | Admitting: Family Medicine

## 2018-04-14 ENCOUNTER — Telehealth: Payer: Self-pay | Admitting: Family Medicine

## 2018-04-14 NOTE — Telephone Encounter (Signed)
Spoke with pt he stated he was driving and would call back to schedule   Please schedule MWV with Katha Cabal and CPE with Dr. Diona Browner.

## 2018-04-18 NOTE — Telephone Encounter (Signed)
Pt came into office scheduled appt for 08/02/18 @ 9am for AMV and 08/04/18 @ 10am for CPE with Dr. Diona Browner

## 2018-05-01 ENCOUNTER — Other Ambulatory Visit: Payer: Self-pay | Admitting: Family Medicine

## 2018-05-26 ENCOUNTER — Other Ambulatory Visit: Payer: Self-pay | Admitting: Family Medicine

## 2018-06-28 ENCOUNTER — Other Ambulatory Visit: Payer: Self-pay | Admitting: *Deleted

## 2018-06-28 MED ORDER — SIMVASTATIN 40 MG PO TABS: 40.0000 mg | ORAL_TABLET | Freq: Every day | ORAL | 0 refills | Status: DC

## 2018-07-13 ENCOUNTER — Other Ambulatory Visit: Payer: Self-pay | Admitting: Family Medicine

## 2018-07-25 ENCOUNTER — Encounter: Payer: Medicare Other | Admitting: Family Medicine

## 2018-07-28 ENCOUNTER — Telehealth: Payer: Self-pay | Admitting: Family Medicine

## 2018-07-28 DIAGNOSIS — E7800 Pure hypercholesterolemia, unspecified: Secondary | ICD-10-CM

## 2018-07-28 DIAGNOSIS — Z125 Encounter for screening for malignant neoplasm of prostate: Secondary | ICD-10-CM

## 2018-07-28 NOTE — Telephone Encounter (Signed)
-----   Message from Cloyd Stagers, RT sent at 07/26/2018  1:09 PM EDT ----- Regarding: Lab Orders for Thursday 5.7.2020 Please place lab orders for Thursday 5.7.2020, Doxy.me visit on Friday 5.8.2020 Thank you, Dyke Maes RT(R)

## 2018-08-02 ENCOUNTER — Ambulatory Visit (INDEPENDENT_AMBULATORY_CARE_PROVIDER_SITE_OTHER): Payer: Medicare Other

## 2018-08-02 DIAGNOSIS — Z0000 Encounter for general adult medical examination without abnormal findings: Secondary | ICD-10-CM | POA: Diagnosis not present

## 2018-08-02 NOTE — Progress Notes (Signed)
Subjective:   Carl Contreras is a 63 y.o. male who presents for Medicare Annual/Subsequent preventive examination.  Review of Systems:  N/A Cardiac Risk Factors include: advanced age (>98men, >56 women);male gender     Objective:    Vitals: There were no vitals taken for this visit.  There is no height or weight on file to calculate BMI.  Advanced Directives 08/02/2018 10/30/2015 06/06/2014  Does Patient Have a Medical Advance Directive? Yes Yes Yes  Type of Paramedic of [place];Living will [place];Living will [place] in Chart? No - copy requested No - copy requested -    Tobacco Social History   Tobacco Use  Smoking Status Current Every Day Smoker  . Packs/day: 0.50  . Types: Cigarettes  . Start date: 03/29/1976  Smokeless Tobacco Former Systems developer     Ready to quit: No Counseling given: No   Clinical Intake:  Pre-visit preparation completed: Yes  Pain : No/denies pain Pain Score: 0-No pain     Nutritional Status: BMI > 30  Obese Nutritional Risks: None Diabetes: No  How often do you need to have someone help you when you read instructions, pamphlets, or other written materials from your doctor or pharmacy?: 1 - Never What is the last grade level you completed in school?: Bachelor degree  Interpreter Needed?: No  Comments: pt lives independently Information entered by :: LPinson, LPN  Past Medical History:  Diagnosis Date  . Allergic rhinitis   . Allergy   . Arthritis   . Chronic kidney disease    kidney stone  . Hyperlipidemia   . Hypertension   . Schizophrenic disorder ([place])    paranoid schizophrenic  . Sleep apnea    wears CPAP   Past Surgical History:  Procedure Laterality Date  . COLONOSCOPY    . FACIAL FRACTURE SURGERY     in college  . TONSILLECTOMY     Family History  Problem Relation Age of Onset  . Pancreatitis Father   .  Gallbladder disease Other   . Colon cancer Neg Hx   . Esophageal cancer Neg Hx   . Rectal cancer Neg Hx   . Stomach cancer Neg Hx    Social History   Socioeconomic History  . Marital status: Single    Spouse name: Not on file  . Number of children: Not on file  . Years of education: Not on file  . Highest education level: Not on file  Occupational History  . Occupation: Designer, television/film set  Social Needs  . Financial resource strain: Not on file  . Food insecurity:    Worry: Not on file    Inability: Not on file  . Transportation needs:    Medical: Not on file    Non-medical: Not on file  Tobacco Use  . Smoking status: Current Every Day Smoker    Packs/day: 0.50    Types: Cigarettes    Start date: 03/29/1976  . Smokeless tobacco: Former Network engineer and Sexual Activity  . Alcohol use: Yes    Alcohol/week: 6.0 - 8.0 standard drinks    Types: 6 - 8 Standard drinks or equivalent per week  . Drug use: No  . Sexual activity: Not Currently  Lifestyle  . Physical activity:    Days per week: Not on file    Minutes per session: Not on file  . Stress: Not on file  Relationships  .  Social connections:    Talks on phone: Not on file    Gets together: Not on file    Attends religious service: Not on file    Active member of club or organization: Not on file    Attends meetings of clubs or organizations: Not on file    Relationship status: Not on file  Other Topics Concern  . Not on file  Social History Narrative   No living will, HCPOA: mother Carl Contreras    Outpatient Encounter Medications as of 08/02/2018  Medication Sig  . benztropine (COGENTIN) 1 MG tablet Take 1 mg by mouth daily.  . fluPHENAZine (PROLIXIN) 5 MG tablet Take 5 mg by mouth daily.   Marland Kitchen lisinopril (ZESTRIL) 10 MG tablet TAKE 1 TABLET BY MOUTH ONCE DAILY  . Multiple Vitamin (MULTIVITAMIN) tablet Take 1 tablet by mouth daily.  . Omega-3 Fatty Acids (OMEGA-3 FISH OIL) 1000 MG CAPS Take by mouth.  Take 2 tablet 2 times daily  . simvastatin (ZOCOR) 40 MG tablet Take 1 tablet (40 mg total) by mouth daily.  Marland Kitchen zinc gluconate 50 MG tablet Take 50 mg by mouth daily.   . ziprasidone (GEODON) 60 MG capsule Take 60 mg by mouth daily.  . [DISCONTINUED] loratadine (CLARITIN) 10 MG tablet Take 10 mg by mouth daily.     No facility-administered encounter medications on file as of 08/02/2018.     Activities of Daily Living In your present state of health, do you have any difficulty performing the following activities: 08/02/2018  Hearing? N  Vision? N  Difficulty concentrating or making decisions? N  Walking or climbing stairs? N  Dressing or bathing? N  Doing errands, shopping? N  Preparing Food and eating ? N  Using the Toilet? N  In the past six months, have you accidently leaked urine? N  Do you have problems with loss of bowel control? N  Managing your Medications? N  Managing your Finances? N  Housekeeping or managing your Housekeeping? N  Some recent data might be hidden    Patient Care Team: Jinny Sanders, MD as PCP - General   Assessment:   This is a routine wellness examination for Carl Contreras.  Exercise Activities and Dietary recommendations Current Exercise Habits: Home exercise routine, Type of exercise: walking, Time (Minutes): 30, Frequency (Times/Week): 7, Weekly Exercise (Minutes/Week): 210, Intensity: Mild, Exercise limited by: None identified  Goals    . Patient Stated     Starting 08/02/2018, I will continue to take medications as prescribed.        Fall Risk Fall Risk  08/02/2018 01/27/2017 10/30/2015  Falls in the past year? 1 No No  Comment pt fell while trying lift a log in yard; denies injury - -  Number falls in past yr: 0 - -  Injury with Fall? 0 - -   Depression Screen PHQ 2/9 Scores 08/02/2018 01/27/2017 10/30/2015  PHQ - 2 Score 0 0 0  PHQ- 9 Score 0 - -    Cognitive Function MMSE - Mini Mental State Exam 08/02/2018  Orientation to time 5  Orientation to Place  5  Registration 3  Attention/ Calculation 0  Recall 3  Language- name 2 objects 0  Language- repeat 1  Language- follow 3 step command 0  Language- read & follow direction 0  Write a sentence 0  Copy design 0  Total score 17     PLEASE NOTE: A Mini-Cog screen was completed. Maximum score is 17. A value of  0 denotes this part of [place] MMSE was not completed or the patient failed this part of the Mini-Cog screening.   Mini-Cog Screening Orientation to Time - Max 5 pts Orientation to Place - Max 5 pts Registration - Max 3 pts Recall - Max 3 pts Language Repeat - Max 1 pts      Immunization History  Administered Date(s) Administered  . Td 03/29/1998, 06/17/2009      Screening Tests Health Maintenance  Topic Date Due  . HIV Screening  03/28/2020 (Originally 07/13/1971)  . INFLUENZA VACCINE  04/14/2020 (Originally 10/28/2018)  . TETANUS/TDAP  06/18/2019  . COLONOSCOPY  06/20/2019  . Hepatitis C Screening  Completed       Plan:     I have personally reviewed, addressed, and noted the following in the patient's chart:  A. Medical and social history B. Use of alcohol, tobacco or illicit drugs  C. Current medications and supplements D. Functional ability and status E.  Nutritional status F.  Physical activity G. Advance directives H. List of other physicians I.  Hospitalizations, surgeries, and ER visits in previous 12 months J.  Vitals (unless it is a telemedicine encounter) K. Screenings to include cognitive, depression, hearing, vision (NOTE: hearing and vision screenings not completed in telemedicine encounter) L. Referrals and appointments   In addition, I have reviewed and discussed with patient certain preventive protocols, quality metrics, and best practice recommendations. A written personalized care plan for preventive services and recommendations were provided to patient.  With patient's permission, we connected on 08/02/18 at  9:00 AM EDT by a video  enabled telemedicine application. Two patient identifiers were used to ensure the encounter occurred with the correct person.    Patient was in home and writer was in office.   Signed,   Lindell Noe, MHA, BS, LPN Health Coach

## 2018-08-02 NOTE — Progress Notes (Signed)
I reviewed health advisor's note, was available for consultation, and agree with documentation and plan.   Signed,  Akelia Husted T. Hailey Miles, MD  

## 2018-08-02 NOTE — Progress Notes (Signed)
PCP notes:   Health maintenance:  No gaps identified.   Abnormal screenings:   Fall risk - hx of single fall Fall Risk  08/02/2018 01/27/2017 10/30/2015  Falls in the past year? 1 No No  Comment pt fell while trying lift a log in yard; denies injury - -  Number falls in past yr: 0 - -  Injury with Fall? 0 - -    Patient concerns:   None  Nurse concerns:  None  Next PCP appt:   08/04/18 @ 1000

## 2018-08-02 NOTE — Patient Instructions (Signed)
Carl Contreras , Thank you for taking time to come for your Medicare Wellness Visit. I appreciate your ongoing commitment to your health goals. Please review the following plan we discussed and let me know if I can assist you in the future.   These are the goals we discussed: Goals    . Patient Stated     Starting 08/02/2018, I will continue to take medications as prescribed.        This is a list of the screening recommended for you and due dates:  Health Maintenance  Topic Date Due  . HIV Screening  03/28/2020*  . Flu Shot  04/14/2020*  . Tetanus Vaccine  06/18/2019  . Colon Cancer Screening  06/20/2019  .  Hepatitis C: One time screening is recommended by Center for Disease Control  (CDC) for  adults born from [birth year] through 1964.   Completed  *Topic was postponed. The date shown is not the original due date.   Preventive Care for Adults  A healthy lifestyle and preventive care can promote health and wellness. Preventive health guidelines for adults include the following key practices.  . A routine yearly physical is a good way to check with your health care provider about your health and preventive screening. It is a chance to share any concerns and updates on your health and to receive a thorough exam.  . Visit your dentist for a routine exam and preventive care every 6 months. Brush your teeth twice a day and floss once a day. Good oral hygiene prevents tooth decay and gum disease.  . The frequency of eye exams is based on your age, health, family medical history, use  of contact lenses, and other factors. Follow your health care provider's recommendations for frequency of eye exams.  . Eat a healthy diet. Foods like vegetables, fruits, whole grains, low-fat dairy products, and lean protein foods contain the nutrients you need without too many calories. Decrease your intake of foods high in solid fats, added sugars, and salt. Eat the right amount of calories for you. Get  information about a proper diet from your health care provider, if necessary.  . Regular physical exercise is one of the most important things you can do for your health. Most adults should get at least 150 minutes of moderate-intensity exercise (any activity that increases your heart rate and causes you to sweat) each week. In addition, most adults need muscle-strengthening exercises on 2 or more days a week.  Silver Sneakers may be a benefit available to you. To determine eligibility, you may visit the website: www.silversneakers.com or contact program at 719-061-6799 Mon-Fri between 8AM-8PM.   . Maintain a healthy weight. The body mass index (BMI) is a screening tool to identify possible weight problems. It provides an estimate of body fat based on height and weight. Your health care provider can find your BMI and can help you achieve or maintain a healthy weight.   For adults 20 years and older: ? A BMI below 18.5 is considered underweight. ? A BMI of 18.5 to 24.9 is normal. ? A BMI of 25 to 29.9 is considered overweight. ? A BMI of 30 and above is considered obese.   . Maintain normal blood lipids and cholesterol levels by exercising and minimizing your intake of saturated fat. Eat a balanced diet with plenty of fruit and vegetables. Blood tests for lipids and cholesterol should begin at age 68 and be repeated every 5 years. If your lipid or cholesterol levels  are high, you are over 50, or you are at high risk for heart disease, you may need your cholesterol levels checked more frequently. Ongoing high lipid and cholesterol levels should be treated with medicines if diet and exercise are not working.  . If you smoke, find out from your health care provider how to quit. If you do not use tobacco, please do not start.  . If you choose to drink alcohol, please do not consume more than 2 drinks per day. One drink is considered to be 12 ounces (355 mL) of beer, 5 ounces (148 mL) of wine, or 1.5  ounces (44 mL) of liquor.  . If you are 2-63 years old, ask your health care provider if you should take aspirin to prevent strokes.  . Use sunscreen. Apply sunscreen liberally and repeatedly throughout the day. You should seek shade when your shadow is shorter than you. Protect yourself by wearing long sleeves, pants, a wide-brimmed hat, and sunglasses year round, whenever you are outdoors.  . Once a month, do a whole body skin exam, using a mirror to look at the skin on your back. Tell your health care provider of new moles, moles that have irregular borders, moles that are larger than a pencil eraser, or moles that have changed in shape or color.

## 2018-08-03 ENCOUNTER — Other Ambulatory Visit (INDEPENDENT_AMBULATORY_CARE_PROVIDER_SITE_OTHER): Payer: Medicare Other

## 2018-08-03 ENCOUNTER — Other Ambulatory Visit: Payer: Self-pay

## 2018-08-03 DIAGNOSIS — E7800 Pure hypercholesterolemia, unspecified: Secondary | ICD-10-CM | POA: Diagnosis not present

## 2018-08-03 DIAGNOSIS — Z125 Encounter for screening for malignant neoplasm of prostate: Secondary | ICD-10-CM

## 2018-08-03 LAB — COMPREHENSIVE METABOLIC PANEL
ALT: 15 U/L (ref 0–53)
AST: 17 U/L (ref 0–37)
Albumin: 4.4 g/dL (ref 3.5–5.2)
Alkaline Phosphatase: 93 U/L (ref 39–117)
BUN: 14 mg/dL (ref 6–23)
CO2: 27 mEq/L (ref 19–32)
Calcium: 9.4 mg/dL (ref 8.4–10.5)
Chloride: 96 mEq/L (ref 96–112)
Creatinine, Ser: 1.26 mg/dL (ref 0.40–1.50)
GFR: 57.98 mL/min — ABNORMAL LOW (ref >60.00)
Glucose, Bld: 99 mg/dL (ref 70–99)
Potassium: 4.3 mEq/L (ref 3.5–5.1)
Sodium: 130 mEq/L — ABNORMAL LOW (ref 135–145)
Total Bilirubin: 0.6 mg/dL (ref 0.2–1.2)
Total Protein: 6.7 g/dL (ref 6.0–8.3)

## 2018-08-03 LAB — LIPID PANEL
Cholesterol: 108 mg/dL (ref 0–200)
HDL: 49.2 mg/dL (ref >39.00)
LDL Cholesterol: 37 mg/dL (ref 0–99)
NonHDL: 59.15
Total CHOL/HDL Ratio: 2
Triglycerides: 109 mg/dL (ref 0.0–149.0)
VLDL: 21.8 mg/dL (ref 0.0–40.0)

## 2018-08-03 LAB — PSA, MEDICARE: PSA: 0.88 ng/ml (ref 0.10–4.00)

## 2018-08-04 ENCOUNTER — Encounter: Payer: Self-pay | Admitting: Family Medicine

## 2018-08-04 ENCOUNTER — Ambulatory Visit (INDEPENDENT_AMBULATORY_CARE_PROVIDER_SITE_OTHER): Payer: Medicare Other | Admitting: Family Medicine

## 2018-08-04 VITALS — Wt 215.0 lb

## 2018-08-04 DIAGNOSIS — E6609 Other obesity due to excess calories: Secondary | ICD-10-CM

## 2018-08-04 DIAGNOSIS — I10 Essential (primary) hypertension: Secondary | ICD-10-CM | POA: Diagnosis not present

## 2018-08-04 DIAGNOSIS — F17200 Nicotine dependence, unspecified, uncomplicated: Secondary | ICD-10-CM

## 2018-08-04 DIAGNOSIS — E7800 Pure hypercholesterolemia, unspecified: Secondary | ICD-10-CM

## 2018-08-04 DIAGNOSIS — E871 Hypo-osmolality and hyponatremia: Secondary | ICD-10-CM

## 2018-08-04 DIAGNOSIS — Z6831 Body mass index (BMI) 31.0-31.9, adult: Secondary | ICD-10-CM

## 2018-08-04 MED ORDER — SIMVASTATIN 40 MG PO TABS: 40.0000 mg | ORAL_TABLET | Freq: Every day | ORAL | 3 refills | Status: DC

## 2018-08-04 MED ORDER — LISINOPRIL 10 MG PO TABS: 10.0000 mg | ORAL_TABLET | Freq: Every day | ORAL | 3 refills | Status: DC

## 2018-08-04 NOTE — Progress Notes (Signed)
VIRTUAL VISIT Due to national recommendations of social distancing due to [place] 19, a virtual visit is felt to be most appropriate for this patient at this time.   I connected with the patient on 08/04/18 at 10:00 AM EDT by virtual telehealth platform and verified that I am speaking with the correct person using two identifiers.   I discussed the limitations, risks, security and privacy concerns of performing an evaluation and management service by  virtual telehealth platform and the availability of in person appointments. I also discussed with the patient that there may be a patient responsible charge related to this service. The patient expressed understanding and agreed to proceed.  Patient location: Home Provider Location: [facility] Participants: Eliezer Lofts and Deanna Artis   Chief Complaint  Patient presents with  . Annual Exam    Part 2    History of Present Illness: The patient presents for review of chronic health problems. He/She also has the following acute concerns today: none  The patient saw Candis Musa, LPN for medicare wellness. Note reviewed in detail and important notes copied below. Health maintenance: No gaps identified.  Abnormal screenings:  Fall risk - hx of single fall Fall Risk  08/02/2018 01/27/2017 10/30/2015  Falls in the past year? 1 No No  Comment pt fell while trying lift a log in yard; denies injury - -  Number falls in past yr: 0 - -  Injury with Fall? 0     08/04/18  Hypertension: Need re-eval. Previously well controlled on lisinopril. BP Readings from Last 3 Encounters:  01/27/17 120/70  04/27/16 130/70  10/30/15 104/70  Using medication without problems or lightheadedness: none Chest pain with exertion:none Edema:none Short of breath:none Average home BPs: Other issues:  Elevated Cholesterol: LDL at goal on simvastatin Lab Results  Component Value Date   CHOL 108 08/03/2018   HDL 49.20 08/03/2018   LDLCALC 37  08/03/2018   LDLDIRECT 72.7 06/11/2009   TRIG 109.0 08/03/2018   CHOLHDL 2 08/03/2018  Using medications without problems: Muscle aches:  Diet compliance: w Exercise: Other complaints:   hyponatremia: limits salt and drinks  Schizophrenia: Followed by psychiatry on Geodone, cogentin and prolixin.   Smoker; refused to quit despite known risk. 7-10 a day.  COVID 19 screen No recent travel or known exposure to COVID19 The patient denies respiratory symptoms of COVID 19 at this time.  The importance of social distancing was discussed today.   Review of Systems  Constitutional: Negative for chills and fever.  HENT: Negative for congestion and ear pain.   Eyes: Negative for pain and redness.  Respiratory: Negative for cough and shortness of breath.   Cardiovascular: Negative for chest pain, palpitations and leg swelling.  Gastrointestinal: Negative for abdominal pain, blood in stool, constipation, diarrhea, nausea and vomiting.  Genitourinary: Negative for dysuria.  Musculoskeletal: Negative for falls and myalgias.  Skin: Negative for rash.  Neurological: Negative for dizziness.  Psychiatric/Behavioral: Negative for depression. The patient is not nervous/anxious.       Past Medical History:  Diagnosis Date  . Allergic rhinitis   . Allergy   . Arthritis   . Chronic kidney disease    kidney stone  . Hyperlipidemia   . Hypertension   . 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 Sleep apnea    wears CPAP    reports that he has been smoking cigarettes. He started smoking about 42 years ago. He has been smoking about 0.50  packs per day. He has quit using smokeless tobacco. He reports current alcohol use of about 6.0 - 8.0 standard drinks of alcohol per week. He reports that he does not use drugs.   Current Outpatient Medications:  .  benztropine (COGENTIN) 1 MG tablet, Take 1 mg by mouth daily., Disp: , Rfl:  .  fluPHENAZine (PROLIXIN) 5 MG tablet, Take 5 mg by  mouth daily. , Disp: , Rfl:  .  lisinopril (ZESTRIL) 10 MG tablet, TAKE 1 TABLET BY MOUTH ONCE DAILY, Disp: 30 tablet, Rfl: 0 .  Multiple Vitamin (MULTIVITAMIN) tablet, Take 1 tablet by mouth daily., Disp: , Rfl:  .  Omega-3 Fatty Acids (OMEGA-3 FISH OIL) 1000 MG CAPS, Take by mouth. Take 2 tablet 2 times daily, Disp: , Rfl:  .  simvastatin (ZOCOR) 40 MG tablet, Take 1 tablet (40 mg total) by mouth daily., Disp: 90 tablet, Rfl: 0 .  zinc gluconate 50 MG tablet, Take 50 mg by mouth daily. , Disp: , Rfl:  .  ziprasidone (GEODON) 60 MG capsule, Take 60 mg by mouth daily., Disp: , Rfl:    Observations/Objective: Weight 215 lb (97.5 kg).  Physical Exam  Physical Exam Constitutional:      General: The patient is not in acute distress. Pulmonary:     Effort: Pulmonary effort is normal. No respiratory distress.  Neurological:     Mental Status: The patient is alert and oriented to person, place, and time.  Psychiatric:        Mood and Affect: Mood normal.        Behavior: Behavior normal.   Assessment and Plan The patient's preventative maintenance and recommended screening tests for an annual wellness exam were reviewed in full today. Brought up to date unless services declined.  Counselled on the importance of diet, exercise, and its role in overall health and mortality. The patient's FH and SH was reviewed, including their home life, tobacco status, and drug and alcohol status.   Vaccines: refused flu Prostate Cancer Screen: Nml PSA Lab Results  Component Value Date   PSA 0.88 08/03/2018   PSA 1.24 01/25/2017   PSA 0.99 10/23/2015  Colon Cancer Screen:  05/2014  Dr. Fuller Plan, repeat in 5 years.. due this year      Smoking Status: daily smoker.. precontemplative ETOH/ drug use: none/none Hep C:  negative HIV screen:  refused   PURE HYPERCHOLESTEROLEMIA Well controlled. Continue current medication. Encouraged exercise, weight loss, healthy eating habits.   Class 1 obesity due  to excess calories without serious comorbidity with body mass index (BMI) of 31.0 to 31.9 in adult Encouraged regular exercise and weight loss.  TOBACCO ABUSE Precontemplative. Reviewed danger of smoking. He is not interested in med to help quit.  Essential hypertension, benign Previously well controlled on lisinopril. Labs stable. Pt feels well. Will check BP when comes in in 2 months for in office exam.  Hyponatremia Has had recurrent issue.. encouraged not exceeding 64 ox water a day ( he drinks a lot of water) and to not restrict salt. In past hyponatremia has resolved with these recommendations.   I discussed the assessment and treatment plan with the patient. The patient was provided an opportunity to ask questions and all were answered. The patient agreed with the plan and demonstrated an understanding of the instructions.   The patient was advised to call back or seek an in-person evaluation if the symptoms worsen or if the condition fails to improve as anticipated.   , MD   

## 2018-08-04 NOTE — Assessment & Plan Note (Signed)
Encouraged regular exercise and weight loss.

## 2018-08-04 NOTE — Assessment & Plan Note (Signed)
Precontemplative. Reviewed danger of smoking. He is not interested in med to help quit.

## 2018-08-04 NOTE — Assessment & Plan Note (Signed)
Previously well controlled on lisinopril. Labs stable. Pt feels well. Will check BP when comes in in 2 months for in office exam.

## 2018-08-04 NOTE — Assessment & Plan Note (Signed)
Has had recurrent issue.. encouraged not exceeding 64 ox water a day ( he drinks a lot of water) and to not restrict salt. In past hyponatremia has resolved with these recommendations.

## 2018-08-04 NOTE — Assessment & Plan Note (Signed)
Well controlled. Continue current medication. Encouraged exercise, weight loss, healthy eating habits.  

## 2018-08-09 NOTE — Progress Notes (Signed)
8/25  Pt aware

## 2018-08-11 ENCOUNTER — Encounter (HOSPITAL_COMMUNITY): Payer: Self-pay | Admitting: *Deleted

## 2018-08-11 ENCOUNTER — Other Ambulatory Visit: Payer: Self-pay

## 2018-08-11 ENCOUNTER — Emergency Department (HOSPITAL_COMMUNITY)
Admission: EM | Admit: 2018-08-11 | Discharge: 2018-08-12 | Disposition: A | Discharge status: Home / Self Care | Payer: Medicare Other | Attending: Emergency Medicine | Admitting: Emergency Medicine

## 2018-08-11 ENCOUNTER — Emergency Department (HOSPITAL_COMMUNITY): Payer: Medicare Other

## 2018-08-11 DIAGNOSIS — Z79899 Other long term (current) drug therapy: Secondary | ICD-10-CM | POA: Insufficient documentation

## 2018-08-11 DIAGNOSIS — N189 Chronic kidney disease, unspecified: Secondary | ICD-10-CM | POA: Insufficient documentation

## 2018-08-11 DIAGNOSIS — R4182 Altered mental status, unspecified: Secondary | ICD-10-CM | POA: Diagnosis not present

## 2018-08-11 DIAGNOSIS — R569 Unspecified convulsions: Secondary | ICD-10-CM | POA: Insufficient documentation

## 2018-08-11 DIAGNOSIS — I129 Hypertensive chronic kidney disease with stage 1 through stage 4 chronic kidney disease, or unspecified chronic kidney disease: Secondary | ICD-10-CM | POA: Diagnosis not present

## 2018-08-11 DIAGNOSIS — F17210 Nicotine dependence, cigarettes, uncomplicated: Secondary | ICD-10-CM | POA: Diagnosis not present

## 2018-08-11 DIAGNOSIS — R55 Syncope and collapse: Secondary | ICD-10-CM | POA: Diagnosis not present

## 2018-08-11 LAB — CBC WITH DIFFERENTIAL/PLATELET
Abs Immature Granulocytes: 0.04 10*3/uL (ref 0.00–0.07)
Basophils Absolute: 0.1 10*3/uL (ref 0.0–0.1)
Basophils Relative: 1 %
Eosinophils Absolute: 0.1 10*3/uL (ref 0.0–0.5)
Eosinophils Relative: 1 %
HCT: 40 % (ref 39.0–52.0)
Hemoglobin: 14.6 g/dL (ref 13.0–17.0)
Immature Granulocytes: 0 %
Lymphocytes Relative: 14 %
Lymphs Abs: 1.4 10*3/uL (ref 0.7–4.0)
MCH: 32 pg (ref 26.0–34.0)
MCHC: 36.5 g/dL — ABNORMAL HIGH (ref 30.0–36.0)
MCV: 87.7 fL (ref 80.0–100.0)
Monocytes Absolute: 0.9 10*3/uL (ref 0.1–1.0)
Monocytes Relative: 8 %
Neutro Abs: 7.9 10*3/uL — ABNORMAL HIGH (ref 1.7–7.7)
Neutrophils Relative %: 76 %
Platelets: 214 10*3/uL (ref 150–400)
RBC: 4.56 MIL/uL (ref 4.22–5.81)
RDW: 12.1 % (ref 11.5–15.5)
WBC: 10.4 10*3/uL (ref 4.0–10.5)
nRBC: 0 % (ref 0.0–0.2)

## 2018-08-11 LAB — COMPREHENSIVE METABOLIC PANEL
ALT: 17 U/L (ref 0–44)
AST: 44 U/L — ABNORMAL HIGH (ref 15–41)
Albumin: 3.9 g/dL (ref 3.5–5.0)
Alkaline Phosphatase: 74 U/L (ref 38–126)
Anion gap: 11 (ref 5–15)
BUN: 15 mg/dL (ref 8–23)
CO2: 21 mmol/L — ABNORMAL LOW (ref 22–32)
Calcium: 8.9 mg/dL (ref 8.9–10.3)
Chloride: 95 mmol/L — ABNORMAL LOW (ref 98–111)
Creatinine, Ser: 1.32 mg/dL — ABNORMAL HIGH (ref 0.61–1.24)
GFR calc Af Amer: >60 mL/min (ref >60)
GFR calc non Af Amer: 57 mL/min — ABNORMAL LOW (ref >60)
Glucose, Bld: 132 mg/dL — ABNORMAL HIGH (ref 70–99)
Potassium: 5.2 mmol/L — ABNORMAL HIGH (ref 3.5–5.1)
Sodium: 127 mmol/L — ABNORMAL LOW (ref 135–145)
Total Bilirubin: 1.8 mg/dL — ABNORMAL HIGH (ref 0.3–1.2)
Total Protein: 6.4 g/dL — ABNORMAL LOW (ref 6.5–8.1)

## 2018-08-11 LAB — LIPASE, BLOOD: Lipase: 37 U/L (ref 11–51)

## 2018-08-11 MED ORDER — ONDANSETRON HCL 4 MG/2ML IJ SOLN
4.0000 mg | Freq: Once | INTRAMUSCULAR | Status: AC
  Administered 2018-08-11: 23:00:00 4 mg via INTRAVENOUS
  Filled 2018-08-11: qty 2

## 2018-08-11 MED ORDER — LACTATED RINGERS IV BOLUS: 1000.0000 mL | Freq: Once | INTRAVENOUS | Status: DC

## 2018-08-11 MED ORDER — SODIUM CHLORIDE 0.9 % IV BOLUS
1000.0000 mL | Freq: Once | INTRAVENOUS | Status: AC
  Administered 2018-08-11: 1000 mL via INTRAVENOUS

## 2018-08-11 NOTE — ED Notes (Signed)
Patient transported to CT 

## 2018-08-11 NOTE — Discharge Instructions (Addendum)
Follow up with your PCP and Neurology

## 2018-08-11 NOTE — ED Notes (Signed)
Blood draw unsuccessful. Will informed RN Gerald Stabs

## 2018-08-11 NOTE — ED Provider Notes (Signed)
[facility] EMERGENCY DEPARTMENT Provider Note   CSN: [id number] Arrival date & time:       History   Chief Complaint Chief Complaint  Patient presents with  . Altered Mental Status    HPI Carl Contreras is a 63 y.o. male.  With past medical history of HTN, paranoid schizophrenia, CKD who presents to the ED after a syncopal episode.  Was at some friend's house when his friends noticed that he was not talking right and stumbling a bit.  Patient states he remembers this.  However, shortly after he syncopized.  Per EMS no seizure activity.  Patient endorses that he is not slept in the past 2 days due to "worrying".  Denies any difficulty walking or talking since the incident.  Patient denies any SI, HI.     HPI  Past Medical History:  Diagnosis Date  . Allergic rhinitis   . Allergy   . Arthritis   . Chronic kidney disease    kidney stone  . Hyperlipidemia   . Hypertension   . 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 Sleep apnea    wears CPAP    Patient Active Problem List   Diagnosis Date Noted  . Class 1 obesity due to excess calories without serious comorbidity with body mass index (BMI) of 31.0 to 31.9 in adult 04/27/2016  . Lumbago with sciatica of left side 10/19/2010  . Hyponatremia 06/30/2010  . TOBACCO ABUSE 05/14/2008  . PURE HYPERCHOLESTEROLEMIA 04/05/2007  . Obstructive sleep apnea 03/13/2007  . Restless legs syndrome (RLS) 06/22/2006  . Essential hypertension, benign 06/22/2006  . ALLERGIC RHINITIS 06/22/2006  . SCHIZOPHRENIA 06/21/2006    Past Surgical History:  Procedure Laterality Date  . COLONOSCOPY    . FACIAL FRACTURE SURGERY     in college  . TONSILLECTOMY          Home Medications    Prior to Admission medications   Medication Sig Start Date End Date Taking? Authorizing Provider  benztropine (COGENTIN) 1 MG tablet Take 1 mg by mouth daily.    [provider]  fluPHENAZine (PROLIXIN) 5 MG  tablet Take 5 mg by mouth daily.     [provider]  lisinopril (ZESTRIL) 10 MG tablet Take 1 tablet (10 mg total) by mouth daily. 08/04/18   Bedsole, Amy E, MD  Multiple Vitamin (MULTIVITAMIN) tablet Take 1 tablet by mouth daily.    [provider]  Omega-3 Fatty Acids (OMEGA-3 FISH OIL) 1000 MG CAPS Take by mouth. Take 2 tablet 2 times daily    [provider]  simvastatin (ZOCOR) 40 MG tablet Take 1 tablet (40 mg total) by mouth daily. 08/04/18   Bedsole, Amy E, MD  zinc gluconate 50 MG tablet Take 50 mg by mouth daily.     [provider]  ziprasidone (GEODON) 60 MG capsule Take 60 mg by mouth daily.    [provider]    Family History Family History  Problem Relation Age of Onset  . Pancreatitis Father   . Gallbladder disease Other   . Colon cancer Neg Hx   . Esophageal cancer Neg Hx   . Rectal cancer Neg Hx   . Stomach cancer Neg Hx     Social History Social History   Tobacco Use  . Smoking status: Current Every Day Smoker    Packs/day: 0.50    Types: Cigarettes    Start date: 03/29/1976  . Smokeless tobacco: Former Systems developer  Substance Use Topics  . Alcohol use: Yes    Alcohol/week: 6.0 - 8.0 standard drinks    Types: 6 - 8 Standard drinks or equivalent per week  . Drug use: No     Allergies   Varenicline tartrate   Review of Systems Review of Systems  Constitutional: Negative for chills and fever.  HENT: Negative for ear pain and sore throat.   Eyes: Negative for pain and visual disturbance.  Respiratory: Negative for cough and shortness of breath.   Cardiovascular: Negative for chest pain and palpitations.  Gastrointestinal: Negative for abdominal pain and vomiting.  Genitourinary: Negative for dysuria and hematuria.  Musculoskeletal: Negative for arthralgias and back pain.  Skin: Negative for color change and rash.  Neurological: Negative for seizures and syncope.  All other systems reviewed and are negative.     Physical Exam Updated Vital Signs BP 111/70 (BP Location: Right Arm)   Pulse 79   Temp 98.5 F (36.9 C) (Oral)   Resp 17   Ht 5\' 9"  (1.753 m)   Wt 97.5 kg   SpO2 100%   BMI 31.74 kg/m   Physical Exam Vitals signs and nursing note reviewed.  Constitutional:      General: He is not in acute distress.    Appearance: Normal appearance. He is well-developed. He is not ill-appearing.  HENT:     Head: Normocephalic.   Eyes:     Conjunctiva/sclera: Conjunctivae normal.  Neck:     Musculoskeletal: Neck supple.  Cardiovascular:     Rate and Rhythm: Normal rate and regular rhythm.     Heart sounds: No murmur.  Pulmonary:     Effort: Pulmonary effort is normal. No respiratory distress.     Breath sounds: Normal breath sounds.  Abdominal:     Palpations: Abdomen is soft.     Tenderness: There is no abdominal tenderness.  Skin:    General: Skin is warm and dry.  Neurological:     Mental Status: He is alert.      ED Treatments / Results  Labs (all labs ordered are listed, but only abnormal results are displayed) Labs Reviewed  CBC WITH DIFFERENTIAL/PLATELET - Abnormal; Notable for the following components:      Result Value   MCHC 36.5 (*)    Neutro Abs 7.9 (*)    All other components within normal limits  COMPREHENSIVE METABOLIC PANEL - Abnormal; Notable for the following components:   Sodium 127 (*)    Potassium 5.2 (*)    Chloride 95 (*)    CO2 21 (*)    Glucose, Bld 132 (*)    Creatinine, Ser 1.32 (*)    Total Protein 6.4 (*)    AST 44 (*)    Total Bilirubin 1.8 (*)    GFR calc non Af Amer 57 (*)    All other components within normal limits  LIPASE, BLOOD    EKG None  Radiology Ct Head Wo Contrast  Result Date: 08/11/2018 CLINICAL DATA:  Status post fall with a blow to the head tonight. Initial encounter. EXAM: CT HEAD WITHOUT CONTRAST TECHNIQUE: Contiguous axial images were obtained from the base of the skull through the vertex without intravenous  contrast. COMPARISON:  None. FINDINGS: Brain: No evidence of acute infarction, hemorrhage, hydrocephalus, extra-axial collection or mass lesion/mass effect. Vascular: No hyperdense vessel or unexpected calcification. Skull: Intact.  No focal lesion. Sinuses/Orbits: Status post fixation of the anterior wall of the right maxillary sinus. The left maxillary sinus is  almost completely opacified with thickening of its walls. Other: Soft tissue contusion midline of the forehead is identified. IMPRESSION: Soft tissue contusion on the forehead without underlying fracture or acute intracranial abnormality. Chronic left maxillary sinus disease. Electronically Signed   By: Inge Rise M.D.   On: 08/11/2018 22:20    Procedures Procedures (including critical care time)  Medications Ordered in ED Medications  ondansetron (ZOFRAN) injection 4 mg (4 mg Intravenous Given 08/11/18 2243)  sodium chloride 0.9 % bolus 1,000 mL (1,000 mLs Intravenous New Bag/Given 08/11/18 2249)     Initial Impression / Assessment and Plan / ED Course  I have reviewed the triage vital signs and the nursing notes.  Pertinent labs & imaging results that were available during my care of the patient were reviewed by me and considered in my medical decision making (see chart for details).        Carl Contreras is a 63 y.o. male.  With past medical history of HTN, paranoid schizophrenia, CKD who presents to the ED after a syncopal episode.   On initial exam patient well appearing, not in acute distress. VSS. Physical exam as above abrasions to the face and a normal, non-focal neuro exam.   Labs, EKG and CT head obtained.  CT head was negative for acute intracranial pathology.  Labs showed a bicarb 21.  Patient given IV fluid resuscitation.  EKG as above without signs of ischemia or conduction abnormalities.   Talked with patient's friend, concern for possible seizure. Patient endorse not sleeping for over 24 plus hours and  that he is very exhausted. Seizure vs syncope likely secondary to sleep deprivation.   Hx of Schizophrenia. Patient does not appear acutely psychotic or schizophrenic.   Given negative work up here discussed with patient follow up with PCP and neurologist as well as the importance of sleep. Return precautions given. Patient discharged home in stable condition. All question answered.  Final Clinical Impressions(s) / ED Diagnoses   Final diagnoses:  Syncope, unspecified syncope type  Observed seizure-like activity [facility])    ED Discharge Orders    None       Doneta Public, MD 08/11/18 2340    Jola Schmidt, MD 08/12/18 [phone]

## 2018-08-11 NOTE — ED Triage Notes (Signed)
The pt arrived by gems from home  [place] face froward and struck his forehead  Abrasion mid-forehead not on blood  thinners

## 2018-08-11 NOTE — ED Triage Notes (Signed)
The pt had an episode of not talking or walking right around 1900  None of those symptoms are evident now  n and v  x2 enrout by ems  zofran iv given

## 2018-08-12 MED ORDER — METOCLOPRAMIDE HCL 5 MG/ML IJ SOLN
10.0000 mg | Freq: Once | INTRAMUSCULAR | Status: AC
  Administered 2018-08-12: 10 mg via INTRAVENOUS
  Filled 2018-08-12: qty 2

## 2018-08-14 ENCOUNTER — Telehealth: Payer: Self-pay | Admitting: Family Medicine

## 2018-08-14 DIAGNOSIS — R569 Unspecified convulsions: Secondary | ICD-10-CM

## 2018-08-14 DIAGNOSIS — R404 Transient alteration of awareness: Secondary | ICD-10-CM

## 2018-08-14 DIAGNOSIS — R55 Syncope and collapse: Secondary | ICD-10-CM

## 2018-08-14 NOTE — Telephone Encounter (Signed)
Patient called.  Patient had a seizure on Friday and went to [facility]. Patient called and said he wanted to make an appointment with Dr.Bedsole because he was told he needed to see her before he could be released to drive.  I made an in office appointment with [facility] for tomorrow.  I started the covid screening and patient said he did have a sore throat and body aches.  I cancelled the appointment and told patient I'd check with Dr.Bedsole about the appointment.  Patient said the sore throat is from him throwing up after the seizure and the body aches are from the seizure.  I let patient know we would call him back about the appointment.

## 2018-08-15 ENCOUNTER — Ambulatory Visit: Payer: Medicare Other | Admitting: Family Medicine

## 2018-08-15 DIAGNOSIS — R4182 Altered mental status, unspecified: Secondary | ICD-10-CM | POA: Insufficient documentation

## 2018-08-15 DIAGNOSIS — R569 Unspecified convulsions: Secondary | ICD-10-CM | POA: Insufficient documentation

## 2018-08-15 DIAGNOSIS — R55 Syncope and collapse: Secondary | ICD-10-CM | POA: Insufficient documentation

## 2018-08-15 NOTE — Telephone Encounter (Signed)
No appt with me needed with me unless pt has questions ( virtual okay)... I cannot release him to drive... he needs follow up with neurology.  Has this been made? If not I can place a neurology referral.

## 2018-08-15 NOTE — Telephone Encounter (Signed)
Referral made 

## 2018-08-15 NOTE — Telephone Encounter (Signed)
Patient would like a referral to a neurologist.  Patient prefers Carl Contreras.  Patient hasn't been to a neurologist before.  Patient would like the appointment as soon as possible.

## 2018-08-18 ENCOUNTER — Telehealth: Payer: Self-pay | Admitting: Neurology

## 2018-08-18 NOTE — Telephone Encounter (Signed)
Patient was returning call to [facility] about medications. Thanks!

## 2018-08-22 ENCOUNTER — Telehealth: Payer: Medicare Other | Admitting: Neurology

## 2018-08-22 ENCOUNTER — Other Ambulatory Visit: Payer: Self-pay

## 2018-08-22 NOTE — Progress Notes (Unsigned)
Patient rescheduled for in person visit.

## 2018-10-18 ENCOUNTER — Ambulatory Visit (INDEPENDENT_AMBULATORY_CARE_PROVIDER_SITE_OTHER): Payer: Medicare Other | Admitting: Neurology

## 2018-10-18 ENCOUNTER — Other Ambulatory Visit: Payer: Self-pay

## 2018-10-18 ENCOUNTER — Encounter: Payer: Self-pay | Admitting: Neurology

## 2018-10-18 VITALS — BP 145/89 | HR 68 | Temp 98.5°F | Ht 68.5 in | Wt 216.1 lb

## 2018-10-18 DIAGNOSIS — R55 Syncope and collapse: Secondary | ICD-10-CM | POA: Diagnosis not present

## 2018-10-18 NOTE — Patient Instructions (Addendum)
1. Schedule 1-hour EEG ( Our office will call you to schedule this) 2. Schedule echocardiogram [facility] will call you to schedule an appointment). 3. As per  driving laws, no driving after an episode of loss of consciousness, until 6 months event-free 4. Follow-up in 6 months on 04/18/19 at 3:30 pm, call for any changes

## 2018-10-18 NOTE — Progress Notes (Signed)
NEUROLOGY CONSULTATION NOTE  Carl Contreras MRN: [id number] DOB: 1956/03/08  Referring provider: Dr. Eliezer Lofts Primary care provider: Dr. Eliezer Lofts  Reason for consult:  Syncope versus seizure  Dear [name]:  Thank you for your kind referral of Carl Contreras for consultation of the above symptoms. Although his history is well known to you, please allow me to reiterate it for the purpose of our medical record. Records and images were personally reviewed where available.  HISTORY OF PRESENT ILLNESS: This is a pleasant 63 year old right-handed man with a history of hypertension, hyperlipidemia, paranoid schizophrenia, presenting for evaluation of an episode of loss of consciousness last 08/11/2018. He reports that for 2 days prior he felt that there was something wrong with him, he felt very scared ("had fear") and recalls listening to the news pacing, then staying up all night praying for everyone he knew. The next night he did not get any sleep either. On 5/15, he was at his friend's house and recalls that he was told his eyes were bloodshot red from crying, he felt very tired and got up to take a nap, he walked a few steps then fell on his knees and down on the ground, hitting his forehead. His friend said he was not talking right and stumbling a bit, then passed out. Per EMS no seizure activity. locked up, no tongue bite or incontinence. He came to in the hospital and felt fine, but then had vomiting when he got to the ER. Bloodwork showed normal CBC, CMP showed a sodium of 127, K 5.2, creatinine 1.32, minimally elevated AST 44. He denies any alcohol or substance abuse. He had a head CT without contrast which I personally reviewed, no acute changes, appears normal for age. EKG showed sinus rhythm. He denies any further similar episodes since then, he has tried to avoid sleep deprivation.  He denies similar episodes of "fear" in the past leading to significant sleep deprivation. He  denies any olfactory/gustatory hallucinations, deja vu, rising epigastric sensation, focal numbness/tingling/weakness, myoclonic jerks. He denies any headaches, dizziness (except occasionally when standing up quickly), diplopia, dysarthria/dysphagia, neck/back pain, bowel/bladder dysfunction, palpitations, chest pains. When asked about staring spells, he states that he would stare off with his mouth open since he started his psychiatric medications. He was diagnosed with paranoid schizophrenia at age 66 when he had auditory hallucinations and "had a friend." He denies any hallucinations and states his current regimen of medication of the past 4 years is "the best I've ever been."   Epilepsy Risk Factors:  He had a head injury at age 35 where his jaw needed to be wired, no neurosurgical procedures. He had a normal birth and early development.  There is no history of febrile convulsions, CNS infections such as meningitis/encephalitis, or family history of seizures.  PAST MEDICAL HISTORY: Past Medical History:  Diagnosis Date   Allergic rhinitis    Allergy    Arthritis    Chronic kidney disease    kidney stone   Hyperlipidemia    Hypertension    Schizophrenic disorder ([place])    paranoid schizophrenic   Seizures ([place])    Sleep apnea    wears CPAP    PAST SURGICAL HISTORY: Past Surgical History:  Procedure Laterality Date   COLONOSCOPY     FACIAL FRACTURE SURGERY     in college   TONSILLECTOMY      MEDICATIONS: Current Outpatient Medications on File Prior to Visit  Medication Sig Dispense  Refill   benztropine (COGENTIN) 1 MG tablet Take 1 mg by mouth daily.     fluPHENAZine (PROLIXIN) 5 MG tablet Take 5 mg by mouth daily.      lisinopril (ZESTRIL) 10 MG tablet Take 1 tablet (10 mg total) by mouth daily. 90 tablet 3   Multiple Vitamin (MULTIVITAMIN) tablet Take 1 tablet by mouth daily.     Omega-3 Fatty Acids (OMEGA-3 FISH OIL) 1000 MG CAPS Take by mouth. Take 2 tablet  2 times daily     simvastatin (ZOCOR) 40 MG tablet Take 1 tablet (40 mg total) by mouth daily. 90 tablet 3   zinc gluconate 50 MG tablet Take 50 mg by mouth daily.      ziprasidone (GEODON) 60 MG capsule Take 60 mg by mouth daily.     No current facility-administered medications on file prior to visit.     ALLERGIES: Allergies  Allergen Reactions   Varenicline Tartrate     REACTION: nausea and vomiting    FAMILY HISTORY: Family History  Problem Relation Age of Onset   Pancreatitis Father    Gallbladder disease Other    Colon cancer Neg Hx    Esophageal cancer Neg Hx    Rectal cancer Neg Hx    Stomach cancer Neg Hx     SOCIAL HISTORY: Social History   Socioeconomic History   Marital status: Single    Spouse name: Not on file   Number of children: Not on file   Years of education: Not on file   Highest education level: Not on file  Occupational History   Occupation: professional musician  Social Needs   Financial resource strain: Not on file   Food insecurity    Worry: Not on file    Inability: Not on file   Transportation needs    Medical: Not on file    Non-medical: Not on file  Tobacco Use   Smoking status: Current Every Day Smoker    Packs/day: 0.50    Types: Cigarettes    Start date: 03/29/1976   Smokeless tobacco: Former Systems developer  Substance and Sexual Activity   Alcohol use: Yes    Alcohol/week: 6.0 - 8.0 standard drinks    Types: 6 - 8 Standard drinks or equivalent per week   Drug use: No   Sexual activity: Not Currently  Lifestyle   Physical activity    Days per week: Not on file    Minutes per session: Not on file   Stress: Not on file  Relationships   Social connections    Talks on phone: Not on file    Gets together: Not on file    Attends religious service: Not on file    Active member of club or organization: Not on file    Attends meetings of clubs or organizations: Not on file    Relationship status: Not on file    Intimate partner violence    Fear of current or ex partner: Not on file    Emotionally abused: Not on file    Physically abused: Not on file    Forced sexual activity: Not on file  Other Topics Concern   Not on file  Social History Narrative   No living will, HCPOA: mother Kaiden Pech      Lives alone in a one story home. Has a cat      Right hand      Highest level of edu- BA College    REVIEW OF SYSTEMS: Constitutional:  No fevers, chills, or sweats, no generalized fatigue, change in appetite Eyes: No visual changes, double vision, eye pain Ear, nose and throat: No hearing loss, ear pain, nasal congestion, sore throat Cardiovascular: No chest pain, palpitations Respiratory:  No shortness of breath at rest or with exertion, wheezes GastrointestinaI: No nausea, vomiting, diarrhea, abdominal pain, fecal incontinence Genitourinary:  No dysuria, urinary retention or frequency Musculoskeletal:  No neck pain, back pain Integumentary: No rash, pruritus, skin lesions Neurological: as above Psychiatric: No depression, insomnia, anxiety Endocrine: No palpitations, fatigue, diaphoresis, mood swings, change in appetite, change in weight, increased thirst Hematologic/Lymphatic:  No anemia, purpura, petechiae. Allergic/Immunologic: no itchy/runny eyes, nasal congestion, recent allergic reactions, rashes  PHYSICAL EXAM: Vitals:   10/18/18 0836  BP: (!) 145/89  Pulse: 68  Temp: 98.5 F (36.9 C)  SpO2: 99%   General: No acute distress Head:  Normocephalic/atraumatic Skin/Extremities: No rash, no edema Neurological Exam: Mental status: alert and oriented to person, place, and time, no dysarthria or aphasia, Fund of knowledge is appropriate.  Recent and remote memory are intact.  Attention and concentration are normal.    Able to name objects and repeat phrases. Cranial nerves: CN I: not tested CN II: pupils equal, round and reactive to light, visual fields intact, CN III, IV,  VI:  full range of motion, no nystagmus, no ptosis CN V: facial sensation intact CN VII: upper and lower face symmetric CN VIII: hearing intact to conversation CN IX, X: gag intact, uvula midline CN XI: sternocleidomastoid and trapezius muscles intact CN XII: tongue midline Bulk & Tone: normal, no fasciculations. Motor: 5/5 throughout with no pronator drift. Sensation: intact to light touch, cold. Romberg test negative Deep Tendon Reflexes: +2 throughout, no ankle clonus Plantar responses: downgoing bilaterally Cerebellar: no incoordination on finger to nose testing Gait: narrow-based and steady, able to tandem walk adequately. Tremor: none  IMPRESSION: This is a pleasant 63 year old right-handed man with a history of hypertension, hyperlipidemia, paranoid schizophrenia, presenting for evaluation of an episode of loss of consciousness last 08/11/2018. This occurred in the setting of significant sleep deprivation for 2 days, syncope versus seizure. He has no clear epilepsy risk factors, head CT unremarkable. We discussed that after an initial seizure, unless there are significant risk factors, an abnormal neurological exam, an EEG showing epileptiform abnormalities, and/or abnormal neuroimaging, treatment with an antiepileptic drug is not indicated. A 1-hour EEG will be ordered to assess for focal abnormalities that increase risk for recurrent seizures. Echocardiogram will be done for syncope. We discussed [place] driving restrictions which indicate a patient needs to free of seizures or events of altered awareness for 6 months prior to resuming driving. We discussed avoidance of sleep deprivation. He will follow-up in 6 months and knows to call for any changes.  Thank you for allowing me to participate in the care of this patient. Please do not hesitate to call for any questions or concerns.   Ellouise Newer, M.D.  CC: Dr. Diona Browner

## 2018-10-25 ENCOUNTER — Other Ambulatory Visit: Payer: Self-pay

## 2018-10-25 ENCOUNTER — Ambulatory Visit (INDEPENDENT_AMBULATORY_CARE_PROVIDER_SITE_OTHER): Payer: Medicare Other | Admitting: Neurology

## 2018-10-25 DIAGNOSIS — R55 Syncope and collapse: Secondary | ICD-10-CM

## 2018-11-02 ENCOUNTER — Encounter: Payer: Self-pay | Admitting: Cardiology

## 2018-11-03 ENCOUNTER — Ambulatory Visit (INDEPENDENT_AMBULATORY_CARE_PROVIDER_SITE_OTHER): Payer: Medicare Other | Admitting: Cardiology

## 2018-11-03 ENCOUNTER — Encounter: Payer: Self-pay | Admitting: Cardiology

## 2018-11-03 ENCOUNTER — Telehealth: Payer: Self-pay

## 2018-11-03 ENCOUNTER — Other Ambulatory Visit: Payer: Self-pay

## 2018-11-03 VITALS — Ht 69.0 in | Wt 217.0 lb

## 2018-11-03 DIAGNOSIS — R011 Cardiac murmur, unspecified: Secondary | ICD-10-CM | POA: Diagnosis not present

## 2018-11-03 DIAGNOSIS — R55 Syncope and collapse: Secondary | ICD-10-CM

## 2018-11-03 NOTE — Telephone Encounter (Signed)
-----   Message from Cameron Sprang, MD sent at 11/03/2018  3:32 PM EDT ----- Pls let him know that the brain wave test is normal, continue to monitor symptoms, avoid sleep deprivation. Call for any changes. Thanks!

## 2018-11-03 NOTE — Progress Notes (Signed)
Patient referred by Carl Sanders, MD for loss of consciousness    Subjective:   Carl Contreras, male    DOB: 07-18-1955, 63 y.o.   MRN: [id number]   Chief Complaint  Patient presents with  . Loss of Consciousness  . New Patient (Initial Visit)    HPI  63 y.o. Caucasian male with hypertension, hyperlipidemia, paranoid schizophrenia, presenting with possible loss of consciousness.  History is limited, obtained from the patient. In May 2020, he was worried about something, recollects crying and feeling scared about something. He was with this friend, when he remembers that he fell to the ground while walking and wa "froze". His friend called EMS, he was taken to the hospital, where he had one vomiting episode. He denies ay chest pain, shortness of breath palpitations, He has not had any  Recurrence of his symptoms since then. Neurological workup has essentially been normal. Patient tells me that he now feels fine.   Past Medical History:  Diagnosis Date  . Allergic rhinitis   . Allergy   . Arthritis   . Chronic kidney disease    kidney stone  . Hyperlipidemia   . Hypertension   . Schizophrenic disorder ([place])    paranoid schizophrenic  . Seizures ([place])   . Sleep apnea    wears CPAP     Past Surgical History:  Procedure Laterality Date  . COLONOSCOPY    . FACIAL FRACTURE SURGERY     in college  . TONSILLECTOMY       Social History   Socioeconomic History  . Marital status: Single    Spouse name: Not on file  . Number of children: 0  . Years of education: Not on file  . Highest education level: Not on file  Occupational History  . Occupation: Designer, television/film set  Social Needs  . Financial resource strain: Not on file  . Food insecurity    Worry: Not on file    Inability: Not on file  . Transportation needs    Medical: Not on file    Non-medical: Not on file  Tobacco Use  . Smoking status: Current Every Day Smoker    Packs/day: 0.50    Types:  Cigarettes    Start date: 03/29/1976  . Smokeless tobacco: Former Network engineer and Sexual Activity  . Alcohol use: Yes    Alcohol/week: 6.0 - 8.0 standard drinks    Types: 6 - 8 Standard drinks or equivalent per week    Comment: once in a while   . Drug use: No  . Sexual activity: Not Currently  Lifestyle  . Physical activity    Days per week: Not on file    Minutes per session: Not on file  . Stress: Not on file  Relationships  . Social Herbalist on phone: Not on file    Gets together: Not on file    Attends religious service: Not on file    Active member of club or organization: Not on file    Attends meetings of clubs or organizations: Not on file    Relationship status: Not on file  . Intimate partner violence    Fear of current or ex partner: Not on file    Emotionally abused: Not on file    Physically abused: Not on file    Forced sexual activity: Not on file  Other Topics Concern  . Not on file  Social History Narrative   No living  will, HCPOA: mother Jayceion Lisenby      Lives alone in a one story home. Has a cat      Right hand      Highest level of edu- BA College     Family History  Problem Relation Age of Onset  . Pancreatitis Father   . Gallbladder disease Other   . Stroke Mother   . Colon cancer Neg Hx   . Esophageal cancer Neg Hx   . Rectal cancer Neg Hx   . Stomach cancer Neg Hx      Current Outpatient Medications on File Prior to Visit  Medication Sig Dispense Refill  . benztropine (COGENTIN) 1 MG tablet Take 1 mg by mouth daily.    . fluPHENAZine (PROLIXIN) 5 MG tablet Take 5 mg by mouth daily.     Marland Kitchen lisinopril (ZESTRIL) 10 MG tablet Take 1 tablet (10 mg total) by mouth daily. 90 tablet 3  . Multiple Vitamin (MULTIVITAMIN) tablet Take 1 tablet by mouth daily.    . Omega-3 Fatty Acids (OMEGA-3 FISH OIL) 1000 MG CAPS Take by mouth. Take 2 tablet 2 times daily    . simvastatin (ZOCOR) 40 MG tablet Take 1 tablet (40 mg total)  by mouth daily. 90 tablet 3  . zinc gluconate 50 MG tablet Take 50 mg by mouth daily.     . ziprasidone (GEODON) 60 MG capsule Take 60 mg by mouth daily.     No current facility-administered medications on file prior to visit.     Cardiovascular studies:  EKG 11/03/2018: Sinus rhythm 77 bpm. Baseline wander.   Recent labs: Results for HUCKLEBERRY, MARTINSON (MRN [id number]) as of 11/03/2018 11:52  Ref. Range 08/03/2018 09:20 08/11/2018 21:22  COMPREHENSIVE METABOLIC PANEL Unknown Rpt (A) Rpt (A)  Sodium Latest Ref Range: 135 - 145 mmol/L 130 (L) 127 (L)  Potassium Latest Ref Range: 3.5 - 5.1 mmol/L 4.3 5.2 (H)  Chloride Latest Ref Range: 98 - 111 mmol/L 96 95 (L)  CO2 Latest Ref Range: 22 - 32 mmol/L 27 21 (L)  Glucose Latest Ref Range: 70 - 99 mg/dL 99 132 (H)  BUN Latest Ref Range: 8 - 23 mg/dL 14 15  Creatinine Latest Ref Range: 0.61 - 1.24 mg/dL 1.26 1.32 (H)  Calcium Latest Ref Range: 8.9 - 10.3 mg/dL 9.4 8.9  Anion gap Latest Ref Range: 5 - 15   11  Alkaline Phosphatase Latest Ref Range: 38 - 126 U/L 93 74  Albumin Latest Ref Range: 3.5 - 5.0 g/dL 4.4 3.9  Lipase Latest Ref Range: 11 - 51 U/L  37  AST Latest Ref Range: 15 - 41 U/L 17 44 (H)  ALT Latest Ref Range: 0 - 44 U/L 15 17  Total Protein Latest Ref Range: 6.5 - 8.1 g/dL 6.7 6.4 (L)  Total Bilirubin Latest Ref Range: 0.3 - 1.2 mg/dL 0.6 1.8 (H)  GFR Latest Ref Range: >60.00 mL/min 57.98 (L)   GFR, Est Non African American Latest Ref Range: >60 mL/min  57 (L)  GFR, Est African American Latest Ref Range: >60 mL/min  >60  Total CHOL/HDL Ratio Unknown 2   Cholesterol Latest Ref Range: 0 - 200 mg/dL 108   HDL Cholesterol Latest Ref Range: >39.00 mg/dL 49.20   LDL (calc) Latest Ref Range: 0 - 99 mg/dL 37   NonHDL Unknown 59.15   Triglycerides Latest Ref Range: 0.0 - 149.0 mg/dL 109.0   VLDL Latest Ref Range: 0.0 - 40.0 mg/dL 21.8  Results for WARD, BOISSONNEAULT (MRN [id number]) as of 11/03/2018 11:53  Ref. Range 08/11/2018 21:22   WBC Latest Ref Range: 4.0 - 10.5 K/uL 10.4  RBC Latest Ref Range: 4.22 - 5.81 MIL/uL 4.56  Hemoglobin Latest Ref Range: 13.0 - 17.0 g/dL 14.6  HCT Latest Ref Range: 39.0 - 52.0 % 40.0  MCV Latest Ref Range: 80.0 - 100.0 fL 87.7  MCH Latest Ref Range: 26.0 - 34.0 pg 32.0  MCHC Latest Ref Range: 30.0 - 36.0 g/dL 36.5 (H)  RDW Latest Ref Range: 11.5 - 15.5 % 12.1  Platelets Latest Ref Range: 150 - 400 K/uL 214  nRBC Latest Ref Range: 0.0 - 0.2 % 0.0    Review of Systems  Constitution: Negative for decreased appetite, malaise/fatigue, weight gain and weight loss.  HENT: Negative for congestion.   Eyes: Negative for visual disturbance.  Cardiovascular: Negative for chest pain, dyspnea on exertion, leg swelling, palpitations and syncope.  Respiratory: Negative for cough.   Endocrine: Negative for cold intolerance.  Hematologic/Lymphatic: Does not bruise/bleed easily.  Skin: Negative for itching and rash.  Musculoskeletal: Negative for myalgias.  Gastrointestinal: Negative for abdominal pain, nausea and vomiting.  Genitourinary: Negative for dysuria.  Neurological: Negative for dizziness and weakness.  Psychiatric/Behavioral: The patient is not nervous/anxious.        Schizophrenia  All other systems reviewed and are negative.       Vitals:   11/03/18 1448 11/03/18 1449  SpO2: 98% 99%     Body mass index is 32.05 kg/m. Filed Weights   11/03/18 1437  Weight: 217 lb (98.4 kg)     Objective:   Physical Exam  Constitutional: He is oriented to person, place, and time. He appears well-developed and well-nourished. No distress.  HENT:  Head: Normocephalic and atraumatic.  Eyes: Pupils are equal, round, and reactive to light. Conjunctivae are normal.  Neck: No JVD present.  Cardiovascular: Normal rate, regular rhythm and intact distal pulses.  Pulmonary/Chest: Effort normal and breath sounds normal. He has no wheezes. He has no rales.  Abdominal: Soft. Bowel sounds are  normal. There is no rebound.  Musculoskeletal:        General: No edema.  Lymphadenopathy:    He has no cervical adenopathy.  Neurological: He is alert and oriented to person, place, and time. No cranial nerve deficit.  Skin: Skin is warm and dry.  Psychiatric: He has a normal mood and affect.  Nursing note and vitals reviewed.         Assessment & Recommendations:   63 y.o. Caucasian male with hypertension, hyperlipidemia, paranoid schizophrenia, presenting with possible loss of consciousness.  Based on limited history, it is unclear if this was true loss of consciousness episode or possibly an event related to his schizophrenia. There is no recurrence of symptoms. Physical exam is fairly unremarkable. EKG is normal. Given the rarity of his event, event monitor will likely be of low yield.  I will obtain echocardiogram to rule out structural abnormality.  I will see him on as needed basis.   Thank you for referring the patient to [place]. Please feel free to contact with any questions.  Nigel Mormon, MD [facility] Pager: [phone] Office: [phone] If no answer Cell [phone]

## 2018-11-03 NOTE — Telephone Encounter (Signed)
Left message for pt to return call.

## 2018-11-03 NOTE — Procedures (Signed)
ELECTROENCEPHALOGRAM REPORT  Date of Study: 10/25/2018  Patient's Name: Carl Contreras MRN: [id number] Date of Birth: Feb 21, 1956  Referring Provider: Dr. Ellouise Newer  Clinical History: This is a 63 year old man with an episode of loss of consciousness last 08/11/2018.   Medications: COGENTIN 1 MG tablet PROLIXIN 5 MG tablet ZESTRIL 10 MG tablet MULTIVITAMIN) tablet OMEGA-3 FISH OIL 1000 MG CAPS ZOCOR 40 MG tablet zinc gluconate 50 MG tablet GEODON 60 MG capsule  Technical Summary: A multichannel digital 1-hour EEG recording measured by the international 10-20 system with electrodes applied with paste and impedances below 5000 ohms performed in our laboratory with EKG monitoring in an awake and asleep patient.  Hyperventilation were not performed. Photic stimulation was performed.  The digital EEG was referentially recorded, reformatted, and digitally filtered in a variety of bipolar and referential montages for optimal display.    Description: The patient is awake and asleep during the recording.  During maximal wakefulness, there is a symmetric, medium voltage 10 Hz posterior dominant rhythm that attenuates with eye opening.  The record is symmetric.  During drowsiness and stage I sleep, there is an increase in theta slowing of the background with occasional vertex waves seen. Photic stimulation did not elicit any abnormalities.  There were no epileptiform discharges or electrographic seizures seen.    EKG lead was unremarkable.  Impression: This 1-hour awake and asleep EEG is normal.    Clinical Correlation: A normal EEG does not exclude a clinical diagnosis of epilepsy.  If further clinical questions remain, prolonged EEG may be helpful.  Clinical correlation is advised.   Ellouise Newer, M.D.

## 2018-11-06 ENCOUNTER — Telehealth: Payer: Self-pay

## 2018-11-06 NOTE — Telephone Encounter (Signed)
Patient left msg with after hours returning your call. Thanks!

## 2018-11-06 NOTE — Telephone Encounter (Signed)
Pt informed. He states that he does not want to have anymore tests but that he will keep his appt in Jan.

## 2018-11-06 NOTE — Telephone Encounter (Signed)
-----   Message from Cameron Sprang, MD sent at 11/03/2018  3:32 PM EDT ----- Pls let him know that the brain wave test is normal, continue to monitor symptoms, avoid sleep deprivation. Call for any changes. Thanks!

## 2018-11-06 NOTE — Telephone Encounter (Signed)
Pt informed of normal test results. He states that he does not want any further testing. He will keep his January appt.

## 2018-11-21 ENCOUNTER — Encounter: Payer: Medicare Other | Admitting: Family Medicine

## 2018-11-21 ENCOUNTER — Encounter: Payer: Self-pay | Admitting: Family Medicine

## 2018-11-21 ENCOUNTER — Other Ambulatory Visit: Payer: Self-pay

## 2018-11-21 ENCOUNTER — Ambulatory Visit (INDEPENDENT_AMBULATORY_CARE_PROVIDER_SITE_OTHER): Payer: Medicare Other | Admitting: Family Medicine

## 2018-11-21 VITALS — BP 112/70 | HR 82 | Temp 97.7°F | Ht 68.5 in | Wt 211.2 lb

## 2018-11-21 DIAGNOSIS — I10 Essential (primary) hypertension: Secondary | ICD-10-CM | POA: Diagnosis not present

## 2018-11-21 DIAGNOSIS — G4733 Obstructive sleep apnea (adult) (pediatric): Secondary | ICD-10-CM | POA: Diagnosis not present

## 2018-11-21 DIAGNOSIS — R55 Syncope and collapse: Secondary | ICD-10-CM | POA: Diagnosis not present

## 2018-11-21 NOTE — Assessment & Plan Note (Signed)
Well controlled. Continue current medication.  

## 2018-11-21 NOTE — Assessment & Plan Note (Signed)
Neg cardiology work up except never had ECHO, neg neuro work up.  Has 6 month follow up with neuro.  He refused re-eval sodium today.. eating well per pt high sodium diet.

## 2018-11-21 NOTE — Progress Notes (Signed)
Chief Complaint  Patient presents with  . Annual Exam    Part 2    History of Present Illness: HPI  The patient presents for  review of chronic health problems. He/She also has the following acute concerns today: none   He had a Virtual CPX on 08/04/2018  he is here for HTN follow up in person.  The patient saw a LPN or RN for medicare wellness visit. On 08/02/2018  Prevention and wellness was reviewed in detail. Note reviewed and important notes copied below.  " I feel great" I am no longer worry. Good energy.   Trouble making money given pandemic.  Hypertension:   Good control on lisinopril BP Readings from Last 3 Encounters:  11/21/18 112/70  10/18/18 (!) 145/89  08/11/18 111/70  Using medication without problems or lightheadedness:  None Chest pain with exertion:none Edema:none Short of breath:none Average home BPs: Other issues:   He was seen by cardiology and neurology following seizure on 08/11/2018.  Summary of event: He reports that for 2 days prior he felt that there was something wrong with him, he felt very scared ("had fear") and recalls listening to the news pacing, then staying up all night praying for everyone he knew. The next night he did not get any sleep either. On 5/15, he was at his friend's house and recalls that he was told his eyes were bloodshot red from crying, he felt very tired and got up to take a nap, he walked a few steps then fell on his knees and down on the ground, hitting his forehead. His friend said he was not talking right and stumbling a bit, then passed out. Per EMS no seizure activity. locked up, no tongue bite or incontinence. He came to in the hospital and felt fine, but then had vomiting when he got to the ER. Bloodwork showed normal CBC, CMP showed a sodium of 127, K 5.2, creatinine 1.32, minimally elevated AST 44. He denies any alcohol or substance abuse. He had a head CT without contrast which I personally reviewed, no acute changes,  appears normal for age. EKG showed sinus rhythm. He denies any further similar episodes since then, he has tried to avoid sleep deprivation.  Rec per cards: Based on limited history, it is unclear if this was true loss of consciousness episode or possibly an event related to his schizophrenia. There is no recurrence of symptoms. Physical exam is fairly unremarkable. EKG is normal. Given the rarity of his event, event monitor will likely be of low yield.  He cannot afford ECHO.  Rec per neuro: EEG normal   He has no further episodes. He does not want any furth eval. Cannot afford it. He feels well.   COVID 19 screen No recent travel or known exposure to COVID19 The patient denies respiratory symptoms of COVID 19 at this time.  The importance of social distancing was discussed today.   Review of Systems  Constitutional: Negative for chills and fever.  HENT: Negative for congestion and ear pain.   Eyes: Negative for pain and redness.  Respiratory: Negative for cough and shortness of breath.   Cardiovascular: Negative for chest pain, palpitations and leg swelling.  Gastrointestinal: Negative for abdominal pain, blood in stool, constipation, diarrhea, nausea and vomiting.  Genitourinary: Negative for dysuria.  Musculoskeletal: Negative for falls and myalgias.  Skin: Negative for rash.  Neurological: Negative for dizziness.  Psychiatric/Behavioral: Negative for depression. The patient is not nervous/anxious.  Past Medical History:  Diagnosis Date  . Allergic rhinitis   . Allergy   . Arthritis   . Chronic kidney disease    kidney stone  . Hyperlipidemia   . Hypertension   . Schizophrenic disorder ([place])    paranoid schizophrenic  . Seizures ([place])   . Sleep apnea    wears CPAP    reports that he has been smoking cigarettes. He started smoking about 42 years ago. He has been smoking about 0.50 packs per day. He has quit using smokeless tobacco. He reports current alcohol use of  about 6.0 - 8.0 standard drinks of alcohol per week. He reports that he does not use drugs.   Current Outpatient Medications:  .  benztropine (COGENTIN) 1 MG tablet, Take 1 mg by mouth daily., Disp: , Rfl:  .  fluPHENAZine (PROLIXIN) 5 MG tablet, Take 5 mg by mouth daily. , Disp: , Rfl:  .  lisinopril (ZESTRIL) 10 MG tablet, Take 1 tablet (10 mg total) by mouth daily., Disp: 90 tablet, Rfl: 3 .  Multiple Vitamin (MULTIVITAMIN) tablet, Take 1 tablet by mouth daily., Disp: , Rfl:  .  Omega-3 Fatty Acids (OMEGA-3 FISH OIL) 1000 MG CAPS, Take by mouth. Take 2 tablet 2 times daily, Disp: , Rfl:  .  simvastatin (ZOCOR) 40 MG tablet, Take 1 tablet (40 mg total) by mouth daily., Disp: 90 tablet, Rfl: 3 .  zinc gluconate 50 MG tablet, Take 50 mg by mouth daily. , Disp: , Rfl:  .  ziprasidone (GEODON) 60 MG capsule, Take 60 mg by mouth daily., Disp: , Rfl:    Observations/Objective: Blood pressure 112/70, pulse 82, temperature 97.7 F (36.5 C), temperature source Oral, height 5' 8.5" (1.74 m), weight 211 lb 4 oz (95.8 kg), SpO2 97 %.  Physical Exam Constitutional:      Appearance: He is well-developed.  HENT:     Head: Normocephalic.     Right Ear: Hearing normal.     Left Ear: Hearing normal.     Nose: Nose normal.  Neck:     Thyroid: No thyroid mass or thyromegaly.     Vascular: No carotid bruit.     Trachea: Trachea normal.  Cardiovascular:     Rate and Rhythm: Normal rate and regular rhythm.     Pulses: Normal pulses.     Heart sounds: Heart sounds not distant. No murmur. No friction rub. No gallop.      Comments: No peripheral edema Pulmonary:     Effort: Pulmonary effort is normal. No respiratory distress.     Breath sounds: Normal breath sounds.  Skin:    General: Skin is warm and dry.     Findings: No rash.  Psychiatric:        Speech: Speech normal.        Behavior: Behavior normal.        Thought Content: Thought content normal.      Assessment and Plan   Essential  hypertension, benign Well controlled. Continue current medication.   Syncope Neg cardiology work up except never had ECHO, neg neuro work up.  Has 6 month follow up with neuro.  He refused re-eval sodium today.. eating well per pt high sodium diet.    Obstructive sleep apnea Working on getting new CPAP equipment.     Eliezer Lofts, MD

## 2018-11-21 NOTE — Patient Instructions (Signed)
Sleep 8 hours a night.  Make sure eating healthy 3 meals a day.

## 2018-11-21 NOTE — Assessment & Plan Note (Signed)
Working on getting new CPAP equipment.

## 2018-11-28 ENCOUNTER — Other Ambulatory Visit: Payer: Medicare Other

## 2019-04-18 ENCOUNTER — Telehealth (INDEPENDENT_AMBULATORY_CARE_PROVIDER_SITE_OTHER): Payer: Medicare Other | Admitting: Neurology

## 2019-04-18 ENCOUNTER — Other Ambulatory Visit: Payer: Self-pay

## 2019-04-18 ENCOUNTER — Encounter: Payer: Self-pay | Admitting: Neurology

## 2019-04-18 VITALS — Ht 69.0 in | Wt 215.0 lb

## 2019-04-18 DIAGNOSIS — R55 Syncope and collapse: Secondary | ICD-10-CM | POA: Diagnosis not present

## 2019-04-18 NOTE — Progress Notes (Signed)
Telephone (Audio) Visit The purpose of this telephone visit is to provide medical care while limiting exposure to the novel coronavirus.    Consent was obtained for telephone visit:  Yes.   Answered questions that patient had about telehealth interaction:  Yes.   I discussed the limitations, risks, security and privacy concerns of performing an evaluation and management service by telephone. I also discussed with the patient that there may be a patient responsible charge related to this service. The patient expressed understanding and agreed to proceed.  Pt location: Home Physician Location: office Name of referring provider:  Jinny Sanders, MD I connected with .Carl Contreras at patients initiation/request on 04/18/2019 at  3:30 PM EST by telephone and verified that I am speaking with the correct person using two identifiers.  Pt MRN:  [id number] Pt DOB:  1955/04/28   History of Present Illness:  The patient had a telephone visit on 04/18/2019. Unable to connect via video. He was last seen in the neurology clinic 6 months ago after he had a syncopal episode in May 2020. I personally reviewed EEG done 09/2018 which was normal. He has been doing well and denies any further syncopal episodes since then. He reports sleep is better, he now sleeps every night. He denies any headaches, dizziness, focal numbness/tingling/weakness, no falls.   History on Initial Assessment 10/18/2018: This is a pleasant 64 year old right-handed man with a history of hypertension, hyperlipidemia, paranoid schizophrenia, presenting for evaluation of an episode of loss of consciousness last 08/11/2018. He reports that for 2 days prior he felt that there was something wrong with him, he felt very scared ("had fear") and recalls listening to the news pacing, then staying up all night praying for everyone he knew. The next night he did not get any sleep either. On 5/15, he was at his friend's house and recalls that he was  told his eyes were bloodshot red from crying, he felt very tired and got up to take a nap, he walked a few steps then fell on his knees and down on the ground, hitting his forehead. His friend said he was not talking right and stumbling a bit, then passed out. Per EMS no seizure activity. locked up, no tongue bite or incontinence. He came to in the hospital and felt fine, but then had vomiting when he got to the ER. Bloodwork showed normal CBC, CMP showed a sodium of 127, K 5.2, creatinine 1.32, minimally elevated AST 44. He denies any alcohol or substance abuse. He had a head CT without contrast which I personally reviewed, no acute changes, appears normal for age. EKG showed sinus rhythm. He denies any further similar episodes since then, he has tried to avoid sleep deprivation.  He denies similar episodes of "fear" in the past leading to significant sleep deprivation. He denies any olfactory/gustatory hallucinations, deja vu, rising epigastric sensation, focal numbness/tingling/weakness, myoclonic jerks. He denies any headaches, dizziness (except occasionally when standing up quickly), diplopia, dysarthria/dysphagia, neck/back pain, bowel/bladder dysfunction, palpitations, chest pains. When asked about staring spells, he states that he would stare off with his mouth open since he started his psychiatric medications. He was diagnosed with paranoid schizophrenia at age 84 when he had auditory hallucinations and "had a friend." He denies any hallucinations and states his current regimen of medication of the past 4 years is "the best I've ever been."   Epilepsy Risk Factors:  He had a head injury at age 67 where his  jaw needed to be wired, no neurosurgical procedures. He had a normal birth and early development.  There is no history of febrile convulsions, CNS infections such as meningitis/encephalitis, or family history of seizures.    Observations/Objective:   Vitals:   04/18/19 1052  Weight: 215 lb (97.5  kg)  Height: 5\' 9"  (1.753 m)   Limited due to nature of phone visit. Patient is awake, alert, able to answer questions without dysarthria or confusion.   Assessment and Plan:  This is a pleasant 64 yo RH man with a history of hypertension, hyperlipidemia, paranoid schizophrenia, who had an episode of loss of consciousness last 08/11/2018. This occurred in the setting of significant sleep deprivation for 2 days. EEG normal. We discussed syncopal episode was likely provoked due to significant exhaustion, he has been sleeping better since then, mood is better. He knows to call our office for any changes in symptoms and will follow-up prn.   Follow Up Instructions:   -I discussed the assessment and treatment plan with the patient. The patient was provided an opportunity to ask questions and all were answered. The patient agreed with the plan and demonstrated an understanding of the instructions.   The patient was advised to call back or seek an in-person evaluation if the symptoms worsen or if the condition fails to improve as anticipated.    Total Time spent in visit with the patient was:  5 minutes, of which 100% of the time was spent in counseling and/or coordinating care on the above.   Pt understands and agrees with the plan of care outlined.     Cameron Sprang, MD

## 2019-05-01 ENCOUNTER — Other Ambulatory Visit: Payer: Self-pay | Admitting: Family Medicine

## 2019-08-03 ENCOUNTER — Other Ambulatory Visit: Payer: Self-pay | Admitting: Family Medicine

## 2019-08-22 ENCOUNTER — Other Ambulatory Visit: Payer: Self-pay

## 2019-08-22 ENCOUNTER — Encounter: Payer: Self-pay | Admitting: Internal Medicine

## 2019-08-22 ENCOUNTER — Ambulatory Visit: Payer: Medicare Other | Admitting: Internal Medicine

## 2019-08-22 ENCOUNTER — Other Ambulatory Visit: Payer: Self-pay | Admitting: *Deleted

## 2019-08-22 DIAGNOSIS — F17200 Nicotine dependence, unspecified, uncomplicated: Secondary | ICD-10-CM | POA: Diagnosis not present

## 2019-08-22 DIAGNOSIS — G4733 Obstructive sleep apnea (adult) (pediatric): Secondary | ICD-10-CM

## 2019-08-22 NOTE — Assessment & Plan Note (Signed)
Benefits from CPAP, reporting good control and compliance. Machine is old. He currently needs replacement mask, hose and supplies. Plan- replace old machine, changing to auto 5-20,

## 2019-08-22 NOTE — Assessment & Plan Note (Signed)
Discussed smoking cessation and his previous experience with cessation efforts. Offered referral to our Pharmacy team smoking cessation program- he declined. Advised CXR- he declined.

## 2019-08-22 NOTE — Progress Notes (Signed)
08/22/19- 64 yoM current smoker for sleep evaluation- OSA, Restless Legs Medical problem list includes HTN,Syncope, Allergic Rhinitis. OSA, Low back pain/ sciatica, Obesity, Hypercholesterolemia, Schizophrenia, Tobacco abuse,  NPSG 06/21/03- AHI 14/ hr, desaturation to 87%, body weight 220 lbs, Significant PLMA Meds now include cogentin, prolixin, geodon CPAP 10/ [employer] Download-  Body weight today- 219 lbs -----sleep consult, needs CPAP supplies.  sleeps well.  no leaks with mask. Previously followed by Dr Gwenette Greet, Dr Elsworth Soho. Used Requip in the past for Restless Legs, but stopped it. 2 Phizer Covax Epworth score 3 Reports using CPAP every night, can't sleep without it, takes camping. Machine is old. Notes dry mouth- mostly due to meds. Has used Biotene. He declines pharmacy team consult for smoking cessation. Declines CXR. Plays harmonica and saxophone, teaches music. Not much cough or wheeze. ENT- tonsils/ adenoids. Had limited facial reconstruction with mesh after fall with trauma remote.   Prior to Admission medications   Medication Sig Start Date End Date Taking? Authorizing Provider  benztropine (COGENTIN) 1 MG tablet Take 1 mg by mouth daily.   Yes [provider]  fluPHENAZine (PROLIXIN) 5 MG tablet Take 5 mg by mouth daily.    Yes [provider]  lisinopril (ZESTRIL) 10 MG tablet TAKE 1 TABLET BY MOUTH ONCE DAILY 08/03/19  Yes Bedsole, Amy E, MD  Multiple Vitamin (MULTIVITAMIN) tablet Take 1 tablet by mouth daily.   Yes [provider]  Omega-3 Fatty Acids (OMEGA-3 FISH OIL) 1000 MG CAPS Take by mouth. Take 2 tablet 2 times daily   Yes [provider]  simvastatin (ZOCOR) 40 MG tablet TAKE 1 TABLET BY MOUTH ONCE DAILY 08/03/19  Yes Bedsole, Amy E, MD  zinc gluconate 50 MG tablet Take 50 mg by mouth daily.    Yes [provider]  ziprasidone (GEODON) 60 MG capsule Take 60 mg by mouth daily.   Yes [provider]   Past Medical History:   Diagnosis Date  . Allergic rhinitis   . Allergy   . Arthritis   . Chronic kidney disease    kidney stone  . Hyperlipidemia   . Hypertension   . Schizophrenic disorder ([place])    paranoid schizophrenic  . Seizures ([place])   . Sleep apnea    wears CPAP   Past Surgical History:  Procedure Laterality Date  . COLONOSCOPY    . FACIAL FRACTURE SURGERY     in college  . TONSILLECTOMY     Family History  Problem Relation Age of Onset  . Pancreatitis Father   . Gallbladder disease Other   . Stroke Mother   . Colon cancer Neg Hx   . Esophageal cancer Neg Hx   . Rectal cancer Neg Hx   . Stomach cancer Neg Hx    Social History   Socioeconomic History  . Marital status: Single    Spouse name: Not on file  . Number of children: 0  . Years of education: Not on file  . Highest education level: Not on file  Occupational History  . Occupation: Designer, television/film set  Tobacco Use  . Smoking status: Current Every Day Smoker    Packs/day: 1.00    Years: 40.00    Pack years: 40.00    Types: Cigarettes    Start date: 03/29/1976  . Smokeless tobacco: Former Systems developer  . Tobacco comment: down to 1/2 pack/day  Substance and Sexual Activity  . Alcohol use: Yes    Alcohol/week: 6.0 - 8.0 standard drinks  Types: 6 - 8 Standard drinks or equivalent per week    Comment: once in a while   . Drug use: No  . Sexual activity: Not Currently  Other Topics Concern  . Not on file  Social History Narrative   No living will, HCPOA: mother Yuan Ilgenfritz      Lives alone in a one story home. Has a cat      Right hand      Highest level of edu- BA College   Social Determinants of Health   Financial Resource Strain:   . Difficulty of Paying Living Expenses:   Food Insecurity:   . Worried About Charity fundraiser in the Last Year:   . Arboriculturist in the Last Year:   Transportation Needs:   . Film/video editor (Medical):   Marland Kitchen Lack of Transportation (Non-Medical):   Physical  Activity:   . Days of Exercise per Week:   . Minutes of Exercise per Session:   Stress:   . Feeling of Stress :   Social Connections:   . Frequency of Communication with Friends and Family:   . Frequency of Social Gatherings with Friends and Family:   . Attends Religious Services:   . Active Member of Clubs or Organizations:   . Attends Archivist Meetings:   Marland Kitchen Marital Status:   Intimate Partner Violence:   . Fear of Current or Ex-Partner:   . Emotionally Abused:   Marland Kitchen Physically Abused:   . Sexually Abused:    ROS-see HPI   + = positive Constitutional:    weight loss, night sweats, fevers, chills, fatigue, lassitude. HEENT:    headaches, difficulty swallowing, tooth/dental problems, sore throat,       sneezing, itching, ear ache, nasal congestion, post nasal drip, snoring CV:    chest pain, orthopnea, PND, swelling in lower extremities, anasarca,                                  dizziness, palpitations Resp:   shortness of breath with exertion or at rest.                productive cough,   non-productive cough, coughing up of blood.              change in color of mucus.  wheezing.   Skin:    rash or lesions. GI:  No-   heartburn, indigestion, abdominal pain, nausea, vomiting, diarrhea,                 change in bowel habits, loss of appetite GU: dysuria, change in color of urine, no urgency or frequency.   flank pain. MS:   joint pain, stiffness, decreased range of motion, back pain. Neuro-     nothing unusual Psych:  change in mood or affect.  depression or anxiety.   memory loss.  OBJ- Physical Exam General- Alert, Oriented, Affect-appropriate, Distress- none acute, + overweight Skin- rash-none, lesions- none, excoriation- none Lymphadenopathy- none Head- atraumatic            Eyes- Gross vision intact, PERRLA, conjunctivae and secretions clear            Ears- Hearing, canals-normal            Nose- Clear, no-Septal dev, mucus, polyps, erosion, perforation              Throat- Mallampati III-IV ,  tongue thick+,  mucosa clear , drainage- none, tonsils- absent Neck- flexible , trachea midline, no stridor , thyroid nl, carotid no bruit Chest - symmetrical excursion , unlabored           Heart/CV- RRR , no murmur , no gallop  , no rub, nl s1 s2                           - JVD- none , edema- none, stasis changes- none, varices- none           Lung- clear to P&A, wheeze- none, cough- none , dullness-none, rub- none           Chest wall-  Abd-  Br/ Gen/ Rectal- Not done, not indicated Extrem- cyanosis- none, clubbing, none, atrophy- none, strength- nl Neuro- grossly intact to observation

## 2019-08-22 NOTE — Addendum Note (Signed)
Addended by: Vanessa Barbara on: 08/22/2019 12:41 PM   Modules accepted: Orders

## 2019-08-22 NOTE — Patient Instructions (Signed)
Order- DME [facility] please replace old CPAP machine  Auto 5-20, mask of choice, humidifier, supplies, Airview/ card  Please try to stop smoking before it hurts you !  Please call if we can help

## 2019-11-02 ENCOUNTER — Telehealth: Payer: Self-pay | Admitting: Family Medicine

## 2019-11-02 NOTE — Telephone Encounter (Signed)
Please call and schedule MWV with nurse and CPE with Dr. Diona Browner for early September.

## 2019-11-06 NOTE — Telephone Encounter (Signed)
Carl Contreras I spoke with pt he stated that the last time he called here he was told we could not see him because he has medicad.  He said whoever he talked to was going to find him another provider to see.  Can you check on this for me? I didn't seen any notes

## 2019-11-06 NOTE — Telephone Encounter (Signed)
Left message asking pt to call office  °

## 2019-11-22 ENCOUNTER — Ambulatory Visit: Payer: Medicare Other | Admitting: Internal Medicine

## 2019-12-13 DIAGNOSIS — G4733 Obstructive sleep apnea (adult) (pediatric): Secondary | ICD-10-CM | POA: Diagnosis not present

## 2019-12-18 ENCOUNTER — Telehealth: Payer: Self-pay | Admitting: Family Medicine

## 2019-12-18 NOTE — Telephone Encounter (Signed)
Patient came into office and expressed he is only on medicaid now. He stated he is needing to find a doctor that accepts medicaid. Stated he is needing to discuss this with Dr.Bedsole as he has already mentioned this 3x. Please advise.

## 2019-12-18 NOTE — Telephone Encounter (Signed)
Raquel Sarna.. can you or the appropriate person help this patient? Can we see him or does he need to go somewhere else? Can we get him info on appropriate medicaid providers. He is very nice man, but is frustrated.

## 2019-12-20 NOTE — Telephone Encounter (Signed)
Attempted to reach pt to discuss. Lvm asking him to give me a call back

## 2019-12-20 NOTE — Telephone Encounter (Signed)
I discussed this with patient. He is aware that we are able to see him since he is already an established patient. He scheduled cpe with Bedsole. It looks like he no longer has medicare, so he will not need awv with wellness nurse.

## 2019-12-20 NOTE — Telephone Encounter (Signed)
Discussed with patient. He is aware we can still see him since he is an established patient. We just are not taking new medicaid patients at the time.

## 2019-12-28 ENCOUNTER — Ambulatory Visit (INDEPENDENT_AMBULATORY_CARE_PROVIDER_SITE_OTHER): Payer: Medicare Other | Admitting: Internal Medicine

## 2019-12-28 ENCOUNTER — Other Ambulatory Visit: Payer: Self-pay

## 2019-12-28 ENCOUNTER — Encounter: Payer: Self-pay | Admitting: Internal Medicine

## 2019-12-28 VITALS — BP 128/82 | HR 77 | Temp 95.7°F | Ht 69.0 in | Wt 219.4 lb

## 2019-12-28 DIAGNOSIS — F17200 Nicotine dependence, unspecified, uncomplicated: Secondary | ICD-10-CM | POA: Diagnosis not present

## 2019-12-28 DIAGNOSIS — G4733 Obstructive sleep apnea (adult) (pediatric): Secondary | ICD-10-CM

## 2019-12-28 NOTE — Patient Instructions (Signed)
Order- [employer]- Please reach out to patient to replace supplies Continue CPAP auto 5-20, mask of choice, humidifier, supplies, Airview/ card  Order- refer for mask fitting to sleep center  Please call if we can help

## 2019-12-28 NOTE — Progress Notes (Signed)
08/22/19- 64 yoM current smoker for sleep evaluation- OSA, Restless Legs Medical problem list includes HTN,Syncope, Allergic Rhinitis. OSA, Low back pain/ sciatica, Obesity, Hypercholesterolemia, Schizophrenia, Tobacco abuse,  NPSG 06/21/03- AHI 14/ hr, desaturation to 87%, body weight 220 lbs, Significant PLMA Meds now include cogentin, prolixin, geodon CPAP 10/ [employer]-  Body weight today- 219 lbs -----sleep consult, needs CPAP supplies.  sleeps well.  no leaks with mask. Previously followed by Dr Gwenette Greet, Dr Elsworth Soho. Used Requip in the past for Restless Legs, but stopped it. 2 Phizer Covax Epworth score 3 Reports using CPAP every night, can't sleep without it, takes camping. Machine is old. Notes dry mouth- mostly due to meds. Has used Biotene. He declines pharmacy team consult for smoking cessation. Declines CXR. Plays harmonica and saxophone, teaches music. Not much cough or wheeze. ENT- tonsils/ adenoids. Had limited facial reconstruction with mesh after fall with trauma remote.   12/28/19- 37 yoM Smoker followed for OSA, Restless Legs, complicated by  HTN,Syncope, Allergic Rhinitis. OSA, Low back pain/ sciatica, Obesity, Hypercholesterolemia, Schizophrenia, Tobacco abuse, CPAP auto 5-20/ [employer] Download compliance 100%, AHI 1.5/ hr Body weight today - 219 lbs Dealing with initial CPAP so far but dislikes mask fit. Didn't know how to reach [employer] to discuss mask or cleanoing of machine. Covid vax- 2 Phizer Flu vax- declines, saying previously gave him flu-like illness.  Denies cough, wheeze or breathing discomfort. Not prepared to stop smoking.   ROS see HPI    + = positive Constitutional:    weight loss, night sweats, fevers, chills, fatigue, lassitude. HEENT:    headaches, difficulty swallowing, tooth/dental problems, sore throat,       sneezing, itching, ear ache, nasal congestion, post nasal drip, snoring CV:    chest pain, orthopnea, PND, swelling in lower extremities, anasarca,                                    dizziness, palpitations Resp:   shortness of breath with exertion or at rest.                productive cough,   non-productive cough, coughing up of blood.              change in color of mucus.  wheezing.   Skin:    rash or lesions. GI:  No-   heartburn, indigestion, abdominal pain, nausea, vomiting, diarrhea,                 change in bowel habits, loss of appetite GU: dysuria, change in color of urine, no urgency or frequency.   flank pain. MS:   joint pain, stiffness, decreased range of motion, back pain. Neuro-     nothing unusual Psych:  change in mood or affect.  depression or anxiety.   memory loss.  OBJ- Physical Exam General- Alert, Oriented, Affect-appropriate, Distress- none acute, + overweight Skin- rash-none, lesions- none, excoriation- none Lymphadenopathy- none Head- atraumatic            Eyes- Gross vision intact, PERRLA, conjunctivae and secretions clear            Ears- Hearing, canals-normal            Nose- Clear, no-Septal dev, mucus, polyps, erosion, perforation             Throat- Mallampati III-IV , tongue thick+,  mucosa clear , drainage- none, tonsils- absent Neck- flexible ,  trachea midline, no stridor , thyroid nl, carotid no bruit Chest - symmetrical excursion , unlabored           Heart/CV- RRR , no murmur , no gallop  , no rub, nl s1 s2                           - JVD- none , edema- none, stasis changes- none, varices- none           Lung- clear to P&A, wheeze- none, cough- none , dullness-none, rub- none           Chest wall-  Abd-  Br/ Gen/ Rectal- Not done, not indicated Extrem- cyanosis- none, clubbing, none, atrophy- none, strength- nl Neuro- grossly intact to observation

## 2019-12-28 NOTE — Assessment & Plan Note (Signed)
Benefits with good compliance and control Plan- continue auto 5-20. Given customer service number for [employer] so he can contact them when needed. Refer for mask fitting

## 2019-12-28 NOTE — Assessment & Plan Note (Signed)
Offered support and encouragement to consider trying to cut down and quit. He is not prepared to take this on now.

## 2020-01-02 ENCOUNTER — Other Ambulatory Visit: Payer: Self-pay

## 2020-01-02 ENCOUNTER — Telehealth: Payer: Self-pay | Admitting: Family Medicine

## 2020-01-02 ENCOUNTER — Ambulatory Visit (HOSPITAL_BASED_OUTPATIENT_CLINIC_OR_DEPARTMENT_OTHER): Payer: Medicare Other | Attending: Internal Medicine | Admitting: Internal Medicine

## 2020-01-02 DIAGNOSIS — G4733 Obstructive sleep apnea (adult) (pediatric): Secondary | ICD-10-CM

## 2020-01-02 DIAGNOSIS — E7800 Pure hypercholesterolemia, unspecified: Secondary | ICD-10-CM

## 2020-01-02 NOTE — Telephone Encounter (Signed)
-----   Message from Cloyd Stagers, RT sent at 12/20/2019  2:17 PM EDT ----- Regarding: Lab Orders for Thursday 10.7.2021 Please place lab orders for Thursday 10.7.2021, office visit for physical on Thursday 10.14.2021 Thank you, Dyke Maes RT(R)

## 2020-01-03 ENCOUNTER — Other Ambulatory Visit (INDEPENDENT_AMBULATORY_CARE_PROVIDER_SITE_OTHER): Payer: Medicare Other

## 2020-01-03 DIAGNOSIS — E7800 Pure hypercholesterolemia, unspecified: Secondary | ICD-10-CM | POA: Diagnosis not present

## 2020-01-03 LAB — LIPID PANEL
Cholesterol: 122 mg/dL (ref 0–200)
HDL: 45.9 mg/dL (ref >39.00)
LDL Cholesterol: 53 mg/dL (ref 0–99)
NonHDL: 76.35
Total CHOL/HDL Ratio: 3
Triglycerides: 119 mg/dL (ref 0.0–149.0)
VLDL: 23.8 mg/dL (ref 0.0–40.0)

## 2020-01-03 LAB — COMPREHENSIVE METABOLIC PANEL
ALT: 18 U/L (ref 0–53)
AST: 16 U/L (ref 0–37)
Albumin: 4.3 g/dL (ref 3.5–5.2)
Alkaline Phosphatase: 98 U/L (ref 39–117)
BUN: 16 mg/dL (ref 6–23)
CO2: 30 mEq/L (ref 19–32)
Calcium: 9.8 mg/dL (ref 8.4–10.5)
Chloride: 97 mEq/L (ref 96–112)
Creatinine, Ser: 1.29 mg/dL (ref 0.40–1.50)
GFR: 58.42 mL/min — ABNORMAL LOW (ref >60.00)
Glucose, Bld: 101 mg/dL — ABNORMAL HIGH (ref 70–99)
Potassium: 4.1 mEq/L (ref 3.5–5.1)
Sodium: 131 mEq/L — ABNORMAL LOW (ref 135–145)
Total Bilirubin: 0.4 mg/dL (ref 0.2–1.2)
Total Protein: 6.9 g/dL (ref 6.0–8.3)

## 2020-01-03 NOTE — Progress Notes (Signed)
No critical labs need to be addressed urgently. We will discuss labs in detail at upcoming office visit.   

## 2020-01-04 DIAGNOSIS — G4733 Obstructive sleep apnea (adult) (pediatric): Secondary | ICD-10-CM | POA: Diagnosis not present

## 2020-01-10 ENCOUNTER — Other Ambulatory Visit: Payer: Self-pay

## 2020-01-10 ENCOUNTER — Encounter: Payer: Self-pay | Admitting: Family Medicine

## 2020-01-10 ENCOUNTER — Ambulatory Visit (INDEPENDENT_AMBULATORY_CARE_PROVIDER_SITE_OTHER): Payer: Medicare Other | Admitting: Family Medicine

## 2020-01-10 VITALS — BP 126/78 | HR 85 | Temp 97.6°F | Ht 69.5 in | Wt 216.0 lb

## 2020-01-10 DIAGNOSIS — I10 Essential (primary) hypertension: Secondary | ICD-10-CM

## 2020-01-10 DIAGNOSIS — E7800 Pure hypercholesterolemia, unspecified: Secondary | ICD-10-CM

## 2020-01-10 DIAGNOSIS — G4733 Obstructive sleep apnea (adult) (pediatric): Secondary | ICD-10-CM

## 2020-01-10 DIAGNOSIS — Z0000 Encounter for general adult medical examination without abnormal findings: Secondary | ICD-10-CM | POA: Diagnosis not present

## 2020-01-10 DIAGNOSIS — F209 Schizophrenia, unspecified: Secondary | ICD-10-CM

## 2020-01-10 DIAGNOSIS — Z125 Encounter for screening for malignant neoplasm of prostate: Secondary | ICD-10-CM | POA: Diagnosis not present

## 2020-01-10 DIAGNOSIS — E6609 Other obesity due to excess calories: Secondary | ICD-10-CM | POA: Diagnosis not present

## 2020-01-10 DIAGNOSIS — Z6831 Body mass index (BMI) 31.0-31.9, adult: Secondary | ICD-10-CM

## 2020-01-10 LAB — PSA, MEDICARE: PSA: 1.02 ng/ml (ref 0.10–4.00)

## 2020-01-10 NOTE — Assessment & Plan Note (Signed)
Followed by psychiatry closely on mulitple meds.  Stable control per pt.

## 2020-01-10 NOTE — Progress Notes (Signed)
Chief Complaint  Patient presents with  . Annual Exam    History of Present Illness: HPI  The patient presents for annual medicare wellness, complete physical and review of chronic health problems. He/She also has the following acute concerns today: none  I have personally reviewed the Medicare Annual Wellness questionnaire and have noted 1. The patient's medical and social history 2. Their use of alcohol, tobacco or illicit drugs 3. Their current medications and supplements 4. The patient's functional ability including ADL's, fall risks, home safety risks and hearing or visual             impairment. 5. Diet and physical activities 6. Evidence for depression or mood disorders 7.         Updated provider list Cognitive evaluation was performed and recorded on pt medicare questionnaire form. The patients weight, height, BMI and visual acuity have been recorded in the chart  I have made referrals, counseling and provided education to the patient based review of the above and I have provided the pt with a written personalized care plan for preventive services.   Documentation of this information was scanned into the electronic record under the media tab.   Advance directives and end of life planning reviewed in detail with patient and documented in EMR. Patient given handout on advance care directives if needed. HCPOA and living will updated if needed.   Hearing Screening   125Hz  250Hz  500Hz  1000Hz  2000Hz  3000Hz  4000Hz  6000Hz  8000Hz   Right ear:           Left ear:             Visual Acuity Screening   Right eye Left eye Both eyes  Without correction: 20/20 20/25 20/20   With correction:       Fall Risk  01/10/2020 04/18/2019 10/18/2018 08/18/2018 08/02/2018  Falls in the past year? 0 0 1 1 1   Comment - - - - pt fell while trying lift a log in yard; denies injury  Number falls in past yr: 0 0 0 0 0  Injury with Fall? 0 0 1 1 0  Comment - - Fell face first in gravel during seizure -  -  Follow up - Falls evaluation completed Falls evaluation completed - -      Office Visit from 01/10/2020 in [place] at [facility]  PHQ-2 Total Score 0       Hypertension:   Stable control on lisinopril BP Readings from Last 3 Encounters:  01/10/20 126/78  12/28/19 128/82  08/22/19 140/80  Using medication without problems or lightheadedness: none Chest pain with exertion:none Edema:none Short of breath:none Average home BPs: Other issues:  Wt Readings from Last 3 Encounters:  01/10/20 216 lb (98 kg)  12/28/19 219 lb 6.4 oz (99.5 kg)  08/22/19 219 lb 3.2 oz (99.4 kg)  Body mass index is 31.44 kg/m.   Elevated Cholesterol:  LDL at goal on simvastatin Lab Results  Component Value Date   CHOL 122 01/03/2020   HDL 45.90 01/03/2020   LDLCALC 53 01/03/2020   LDLDIRECT 72.7 06/11/2009   TRIG 119.0 01/03/2020   CHOLHDL 3 01/03/2020  Using medications without problems: Muscle aches:  Diet compliance: Exercise: walking every other day Other complaints:  Schizophrenia followed by Psychiatry.   This visit occurred during the SARS-CoV-2 public health emergency.  Safety protocols were in place, including screening questions prior to the visit, additional usage of staff PPE, and extensive cleaning of exam room while observing appropriate contact  time as indicated for disinfecting solutions.   COVID 19 screen:  No recent travel or known exposure to COVID19 The patient denies respiratory symptoms of COVID 19 at this time. The importance of social distancing was discussed today.     ROS    Past Medical History:  Diagnosis Date  . Allergic rhinitis   . Allergy   . Arthritis   . Chronic kidney disease    kidney stone  . Hyperlipidemia   . Hypertension   . 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 Seizures ([place])   . Sleep apnea    wears CPAP    reports that he has been smoking cigarettes. He started smoking about 43 years ago. He has a  40.00 pack-year smoking history. He has quit using smokeless tobacco. He reports current alcohol use of about 6.0 - 8.0 standard drinks of alcohol per week. He reports that he does not use drugs.   Current Outpatient Medications:  .  benztropine (COGENTIN) 1 MG tablet, Take 1 mg by mouth daily., Disp: , Rfl:  .  fluPHENAZine (PROLIXIN) 5 MG tablet, Take 5 mg by mouth daily. , Disp: , Rfl:  .  lisinopril (ZESTRIL) 10 MG tablet, TAKE 1 TABLET BY MOUTH ONCE DAILY, Disp: 90 tablet, Rfl: 0 .  Multiple Vitamin (MULTIVITAMIN) tablet, Take 1 tablet by mouth daily., Disp: , Rfl:  .  Omega-3 Fatty Acids (OMEGA-3 FISH OIL) 1000 MG CAPS, Take by mouth. Take 2 tablet 2 times daily, Disp: , Rfl:  .  simvastatin (ZOCOR) 40 MG tablet, TAKE 1 TABLET BY MOUTH ONCE DAILY, Disp: 90 tablet, Rfl: 0 .  zinc gluconate 50 MG tablet, Take 50 mg by mouth daily. , Disp: , Rfl:  .  ziprasidone (GEODON) 60 MG capsule, Take 60 mg by mouth daily., Disp: , Rfl:    Observations/Objective: Blood pressure 126/78, pulse 85, temperature 97.6 F (36.4 C), height 5' 9.5" (1.765 m), weight 216 lb (98 kg), SpO2 98 %.  Physical Exam Constitutional:      Appearance: He is well-developed. He is obese.  HENT:     Head: Normocephalic.     Right Ear: Hearing normal.     Left Ear: Hearing normal.     Nose: Nose normal.  Neck:     Thyroid: No thyroid mass or thyromegaly.     Vascular: No carotid bruit.     Trachea: Trachea normal.  Cardiovascular:     Rate and Rhythm: Normal rate and regular rhythm.     Pulses: Normal pulses.     Heart sounds: Heart sounds not distant. No murmur heard.  No friction rub. No gallop.      Comments: No peripheral edema Pulmonary:     Effort: Pulmonary effort is normal. No respiratory distress.     Breath sounds: Normal breath sounds.  Skin:    General: Skin is warm and dry.     Findings: No rash.  Psychiatric:        Speech: Speech normal.        Behavior: Behavior normal.        Thought  Content: Thought content normal.      Assessment and Plan   The patient's preventative maintenance and recommended screening tests for an annual wellness exam were reviewed in full today. Brought up to date unless services declined.  Counselled on the importance of diet, exercise, and its role in overall health and mortality. The patient's FH and SH was reviewed, including  their home life, tobacco status, and drug and alcohol status.   Vaccines:refused flu,Td S/P COVID19 x  3 per pt. Prostate Cancer Screen:Nml PSA.. repeat due Lab Results  Component Value Date   PSA 0.88 08/03/2018   PSA 1.24 01/25/2017   PSA 0.99 10/23/2015        Colon Cancer Screen:05/2014  Dr. Fuller Plan, repeat in 5 years.. due this year      Smoking Status:daily smoker.. precontemplative ETOH/ drug OHF:GBMS/XJDB Hep C: negative HIV screen:refused  Plan Lung cancer CT program after age 77.  PURE HYPERCHOLESTEROLEMIA Well controlled. Continue current medication.   Essential hypertension, benign Well controlled. Continue current medication.   Class 1 obesity due to excess calories without serious comorbidity with body mass index (BMI) of 31.0 to 31.9 in adult Encouraged exercise, weight loss, healthy eating habits.   Obstructive sleep apnea  Followed by pulmonary.  Schizophrenia ([place])  Followed by psychiatry closely on mulitple meds.  Stable control per pt.     Eliezer Lofts, MD

## 2020-01-10 NOTE — Patient Instructions (Addendum)
Please stop at the lab to have labs drawn.  Call Dr. Lynne Leader office  [phone]  to tell them that you may be due for repeat colonoscopy.  Work on increasing exercise. Quit smoking.  Get Tetanus at pharmacy.

## 2020-01-10 NOTE — Assessment & Plan Note (Signed)
Followed by pulmonary 

## 2020-01-10 NOTE — Assessment & Plan Note (Signed)
Encouraged exercise, weight loss, healthy eating habits. ? ?

## 2020-01-10 NOTE — Assessment & Plan Note (Signed)
Well controlled. Continue current medication.  

## 2020-01-12 DIAGNOSIS — G4733 Obstructive sleep apnea (adult) (pediatric): Secondary | ICD-10-CM | POA: Diagnosis not present

## 2020-01-22 ENCOUNTER — Encounter: Payer: Self-pay | Admitting: Gastroenterology

## 2020-01-22 ENCOUNTER — Other Ambulatory Visit: Payer: Self-pay | Admitting: Family Medicine

## 2020-02-11 ENCOUNTER — Other Ambulatory Visit: Payer: Self-pay

## 2020-02-11 ENCOUNTER — Ambulatory Visit (AMBULATORY_SURGERY_CENTER): Payer: Self-pay | Admitting: *Deleted

## 2020-02-11 VITALS — Ht 69.5 in | Wt 218.0 lb

## 2020-02-11 DIAGNOSIS — Z86010 Personal history of colonic polyps: Secondary | ICD-10-CM

## 2020-02-11 MED ORDER — PLENVU 140 G PO SOLR: 1.0000 | Freq: Once | ORAL | 0 refills | Status: AC

## 2020-02-11 NOTE — Progress Notes (Signed)

## 2020-02-12 DIAGNOSIS — G4733 Obstructive sleep apnea (adult) (pediatric): Secondary | ICD-10-CM | POA: Diagnosis not present

## 2020-02-25 ENCOUNTER — Telehealth: Payer: Self-pay | Admitting: Gastroenterology

## 2020-02-25 DIAGNOSIS — Z86010 Personal history of colonic polyps: Secondary | ICD-10-CM

## 2020-02-25 MED ORDER — PEG 3350-KCL-NA BICARB-NACL 420 G PO SOLR: 4000.0000 mL | Freq: Once | ORAL | 0 refills | Status: AC

## 2020-02-25 NOTE — Telephone Encounter (Signed)
Patient unable to pay for his prep. Golytely rx sent to pharmacy, he states he is unable to come to the office to get new instructions. Faxed new prep instructions to his pharmacy.  Pt is aware.

## 2020-02-27 ENCOUNTER — Ambulatory Visit (AMBULATORY_SURGERY_CENTER): Payer: Medicare Other | Admitting: Gastroenterology

## 2020-02-27 ENCOUNTER — Other Ambulatory Visit: Payer: Self-pay

## 2020-02-27 ENCOUNTER — Encounter: Payer: Self-pay | Admitting: Gastroenterology

## 2020-02-27 VITALS — BP 134/83 | HR 69 | Temp 97.1°F | Resp 27 | Ht 69.0 in | Wt 218.0 lb

## 2020-02-27 DIAGNOSIS — D120 Benign neoplasm of cecum: Secondary | ICD-10-CM | POA: Diagnosis not present

## 2020-02-27 DIAGNOSIS — K635 Polyp of colon: Secondary | ICD-10-CM | POA: Diagnosis not present

## 2020-02-27 DIAGNOSIS — Z86010 Personal history of colonic polyps: Secondary | ICD-10-CM

## 2020-02-27 DIAGNOSIS — D124 Benign neoplasm of descending colon: Secondary | ICD-10-CM

## 2020-02-27 DIAGNOSIS — D125 Benign neoplasm of sigmoid colon: Secondary | ICD-10-CM

## 2020-02-27 DIAGNOSIS — K621 Rectal polyp: Secondary | ICD-10-CM

## 2020-02-27 DIAGNOSIS — D127 Benign neoplasm of rectosigmoid junction: Secondary | ICD-10-CM | POA: Diagnosis not present

## 2020-02-27 DIAGNOSIS — D128 Benign neoplasm of rectum: Secondary | ICD-10-CM

## 2020-02-27 DIAGNOSIS — D123 Benign neoplasm of transverse colon: Secondary | ICD-10-CM

## 2020-02-27 MED ORDER — SODIUM CHLORIDE 0.9 % IV SOLN: 500.0000 mL | INTRAVENOUS | Status: DC

## 2020-02-27 NOTE — Op Note (Signed)
[place] Patient Name: Carl Contreras Procedure Date: 02/27/2020 3:13 PM MRN: [id number] Endoscopist: Ladene Artist , MD Age: 64 Referring MD:  Date of Birth: Dec 29, 1955 Gender: Male Account #: [id number] Procedure:                Colonoscopy Indications:              Surveillance: Personal history of adenomatous                            polyps on last colonoscopy 5 years ago Medicines:                Monitored Anesthesia Care Procedure:                Pre-Anesthesia Assessment:                           - Prior to the procedure, a History and Physical                            was performed, and patient medications and                            allergies were reviewed. The patient's tolerance of                            previous anesthesia was also reviewed. The risks                            and benefits of the procedure and the sedation                            options and risks were discussed with the patient.                            All questions were answered, and informed consent                            was obtained. Prior Anticoagulants: The patient has                            taken no previous anticoagulant or antiplatelet                            agents. ASA Grade Assessment: III - A patient with                            severe systemic disease. After reviewing the risks                            and benefits, the patient was deemed in                            satisfactory condition to undergo the procedure.  After obtaining informed consent, the colonoscope                            was passed under direct vision. Throughout the                            procedure, the patient's blood pressure, pulse, and                            oxygen saturations were monitored continuously. The                            Colonoscope was introduced through the anus and                            advanced to the the  cecum, identified by                            appendiceal orifice and ileocecal valve. The                            ileocecal valve, appendiceal orifice, and rectum                            were photographed. The quality of the bowel                            preparation was adequate after extensive lavage and                            suction. The colonoscopy was performed without                            difficulty. The patient tolerated the procedure                            well. Scope In: 3:33:28 PM Scope Out: 4:04:43 PM Scope Withdrawal Time: 0 hours 28 minutes 49 seconds  Total Procedure Duration: 0 hours 31 minutes 15 seconds  Findings:                 The perianal and digital rectal examinations were                            normal.                           A 14 mm polyp was found in the cecum. The polyp was                            sessile. The polyp was removed with a cold snare.                            Resection and retrieval were complete.  14 sessile polyps were found in the rectum (1),                            sigmoid colon (4), descending colon (6) and                            transverse colon (3). The polyps were 5 to 8 mm in                            size. These polyps were removed with a cold snare.                            Resection and retrieval were complete.                           The exam was otherwise without abnormality on                            direct and retroflexion views. Complications:            No immediate complications. Estimated blood loss:                            None. Estimated Blood Loss:     Estimated blood loss: none. Impression:               - One 14 mm polyp in the cecum, removed with a cold                            snare. Resected and retrieved.                           - Fourteen 5 to 8 mm polyps in the rectum, in the                            sigmoid colon, in the descending  colon and in the                            transverse colon, removed with a cold snare.                            Resected and retrieved.                           - The examination was otherwise normal on direct                            and retroflexion views. Recommendation:           - Repeat colonoscopy, likely in 3 years, after                            studies are complete for surveillance based on  pathology results.                           - Patient has a contact number available for                            emergencies. The signs and symptoms of potential                            delayed complications were discussed with the                            patient. Return to normal activities tomorrow.                            Written discharge instructions were provided to the                            patient.                           - Resume previous diet.                           - Continue present medications.                           - Await pathology results.                           - No aspirin, ibuprofen, naproxen, or other                            non-steroidal anti-inflammatory drugs for 2 weeks                            after polyp removal. Ladene Artist, MD 02/27/2020 4:09:13 PM This report has been signed electronically.

## 2020-02-27 NOTE — Progress Notes (Signed)
PT taken to PACU. Monitors in place. VSS. Report given to RN. 

## 2020-02-27 NOTE — Patient Instructions (Signed)
YOU HAD AN ENDOSCOPIC PROCEDURE TODAY AT THE [facility]:   Refer to the procedure report that was given to you for any specific questions about what was found during the examination.  If the procedure report does not answer your questions, please call your gastroenterologist to clarify.  If you requested that your care partner not be given the details of your procedure findings, then the procedure report has been included in a sealed envelope for you to review at your convenience later.  YOU SHOULD EXPECT: Some feelings of bloating in the abdomen. Passage of more gas than usual.  Walking can help get rid of the air that was put into your GI tract during the procedure and reduce the bloating. If you had a lower endoscopy (such as a colonoscopy or flexible sigmoidoscopy) you may notice spotting of blood in your stool or on the toilet paper. If you underwent a bowel prep for your procedure, you may not have a normal bowel movement for a few days.  Please Note:  You might notice some irritation and congestion in your nose or some drainage.  This is from the oxygen used during your procedure.  There is no need for concern and it should clear up in a day or so.  SYMPTOMS TO REPORT IMMEDIATELY:   Following lower endoscopy (colonoscopy or flexible sigmoidoscopy):  Excessive amounts of blood in the stool  Significant tenderness or worsening of abdominal pains  Swelling of the abdomen that is new, acute  Fever of 100F or higher  For urgent or emergent issues, a gastroenterologist can be reached at any hour by calling [phone]. Do not use MyChart messaging for urgent concerns.    DIET:  We do recommend a small meal at first, but then you may proceed to your regular diet.  Drink plenty of fluids but you should avoid alcoholic beverages for 24 hours.  ACTIVITY:  You should plan to take it easy for the rest of today and you should NOT DRIVE or use heavy machinery until tomorrow (because  of the sedation medicines used during the test).    FOLLOW UP: Our staff will call the number listed on your records 48-72 hours following your procedure to check on you and address any questions or concerns that you may have regarding the information given to you following your procedure. If we do not reach you, we will leave a message.  We will attempt to reach you two times.  During this call, we will ask if you have developed any symptoms of COVID 19. If you develop any symptoms (ie: fever, flu-like symptoms, shortness of breath, cough etc.) before then, please call [phone].  If you test positive for Covid 19 in the 2 weeks post procedure, please call and report this information to us.    If any biopsies were taken you will be contacted by phone or by letter within the next 1-3 weeks.  Please call us at [phone] if you have not heard about the biopsies in 3 weeks.    SIGNATURES/CONFIDENTIALITY: You and/or your care partner have signed paperwork which will be entered into your electronic medical record.  These signatures attest to the fact that that the information above on your After Visit Summary has been reviewed and is understood.  Full responsibility of the confidentiality of this discharge information lies with you and/or your care-partner. 

## 2020-02-27 NOTE — Progress Notes (Signed)
Vs  I have reviewed the patient's medical history in detail and updated the computerized patient record.  

## 2020-02-29 ENCOUNTER — Telehealth: Payer: Self-pay

## 2020-02-29 DIAGNOSIS — G4733 Obstructive sleep apnea (adult) (pediatric): Secondary | ICD-10-CM | POA: Diagnosis not present

## 2020-02-29 NOTE — Telephone Encounter (Signed)
Left message on answering machine. 

## 2020-02-29 NOTE — Telephone Encounter (Signed)
Second post procedure follow up call, no answer 

## 2020-03-13 DIAGNOSIS — G4733 Obstructive sleep apnea (adult) (pediatric): Secondary | ICD-10-CM | POA: Diagnosis not present

## 2020-03-17 ENCOUNTER — Encounter: Payer: Self-pay | Admitting: Gastroenterology

## 2020-04-29 DIAGNOSIS — G4733 Obstructive sleep apnea (adult) (pediatric): Secondary | ICD-10-CM | POA: Diagnosis not present

## 2020-06-11 DIAGNOSIS — G4733 Obstructive sleep apnea (adult) (pediatric): Secondary | ICD-10-CM | POA: Diagnosis not present

## 2020-06-26 NOTE — Progress Notes (Deleted)
HPI 62 yoM Smoker followed for OSA, Restless Legs, complicated by  HTN,Syncope, Allergic Rhinitis. OSA, Low back pain/ sciatica, Obesity, Hypercholesterolemia, Schizophrenia, Tobacco abuse, NPSG 06/21/03- AHI 14/ hr, desaturation to 87%, body weight 220 lbs, Significant PLMA   ====================================================== 12/28/19- 41 yoM Smoker followed for OSA, Restless Legs, complicated by  HTN,Syncope, Allergic Rhinitis. OSA, Low back pain/ sciatica, Obesity, Hypercholesterolemia, Schizophrenia, Tobacco abuse, CPAP auto 5-20/ [employer] Download compliance 100%, AHI 1.5/ hr Body weight today - 219 lbs Dealing with initial CPAP so far but dislikes mask fit. Didn't know how to reach [employer] to discuss mask or cleanoing of machine. Covid vax- 2 Phizer Flu vax- declines, saying previously gave him flu-like illness.  Denies cough, wheeze or breathing discomfort. Not prepared to stop smoking.   06/27/20-  54 yoM Smoker followed for OSA, Restless Legs, complicated by  HTN,Syncope, Allergic Rhinitis. OSA, Low back pain/ sciatica, Obesity, Hypercholesterolemia, Schizophrenia, Tobacco abuse, CPAP auto 5-20/ [employer] Download- Body weight today- Covid vax- Flu vax-   ROS see HPI    + = positive Constitutional:    weight loss, night sweats, fevers, chills, fatigue, lassitude. HEENT:    headaches, difficulty swallowing, tooth/dental problems, sore throat,       sneezing, itching, ear ache, nasal congestion, post nasal drip, snoring CV:    chest pain, orthopnea, PND, swelling in lower extremities, anasarca,                                   dizziness, palpitations Resp:   shortness of breath with exertion or at rest.                productive cough,   non-productive cough, coughing up of blood.              change in color of mucus.  wheezing.   Skin:    rash or lesions. GI:  No-   heartburn, indigestion, abdominal pain, nausea, vomiting, diarrhea,                 change in bowel habits, loss of  appetite GU: dysuria, change in color of urine, no urgency or frequency.   flank pain. MS:   joint pain, stiffness, decreased range of motion, back pain. Neuro-     nothing unusual Psych:  change in mood or affect.  depression or anxiety.   memory loss.  OBJ- Physical Exam General- Alert, Oriented, Affect-appropriate, Distress- none acute, + overweight Skin- rash-none, lesions- none, excoriation- none Lymphadenopathy- none Head- atraumatic            Eyes- Gross vision intact, PERRLA, conjunctivae and secretions clear            Ears- Hearing, canals-normal            Nose- Clear, no-Septal dev, mucus, polyps, erosion, perforation             Throat- Mallampati III-IV , tongue thick+,  mucosa clear , drainage- none, tonsils- absent Neck- flexible , trachea midline, no stridor , thyroid nl, carotid no bruit Chest - symmetrical excursion , unlabored           Heart/CV- RRR , no murmur , no gallop  , no rub, nl s1 s2                           - JVD- none , edema- none, stasis changes-  none, varices- none           Lung- clear to P&A, wheeze- none, cough- none , dullness-none, rub- none           Chest wall-  Abd-  Br/ Gen/ Rectal- Not done, not indicated Extrem- cyanosis- none, clubbing, none, atrophy- none, strength- nl Neuro- grossly intact to observation

## 2020-06-27 ENCOUNTER — Ambulatory Visit: Payer: Medicare Other | Admitting: Internal Medicine

## 2020-07-12 DIAGNOSIS — G4733 Obstructive sleep apnea (adult) (pediatric): Secondary | ICD-10-CM | POA: Diagnosis not present

## 2020-08-11 DIAGNOSIS — Z23 Encounter for immunization: Secondary | ICD-10-CM | POA: Diagnosis not present

## 2020-09-09 DIAGNOSIS — G4733 Obstructive sleep apnea (adult) (pediatric): Secondary | ICD-10-CM | POA: Diagnosis not present

## 2020-09-10 DIAGNOSIS — G4733 Obstructive sleep apnea (adult) (pediatric): Secondary | ICD-10-CM | POA: Diagnosis not present

## 2020-10-21 ENCOUNTER — Other Ambulatory Visit: Payer: Self-pay | Admitting: Family Medicine

## 2020-12-26 DIAGNOSIS — G4733 Obstructive sleep apnea (adult) (pediatric): Secondary | ICD-10-CM | POA: Diagnosis not present

## 2021-01-20 ENCOUNTER — Telehealth: Payer: Self-pay | Admitting: Family Medicine

## 2021-01-20 NOTE — Telephone Encounter (Signed)
Please schedule Medicare Wellness with health educator and CPE with Dr. Diona Browner.

## 2021-01-20 NOTE — Telephone Encounter (Signed)
Called patient and got him scheduled for 12/27 at 220 for CPE and 12/20 @915  for cpe labs

## 2021-02-15 IMAGING — CT CT HEAD WITHOUT CONTRAST
4 series · 16 of 47 positions shown, 18 images · non-contrast
Comparison: None.

CLINICAL DATA: Status post fall with a blow to the head tonight.
Initial encounter.

EXAM:
CT HEAD WITHOUT CONTRAST
TECHNIQUE: Contiguous axial images were obtained from the base of the skull
through the vertex without intravenous contrast.

[Series 2: head wo · axial · 0.47mm/px · z∈[-80,+46]mm · 6 of 36 slices shown, 8 images]
[im 6/36  brain]
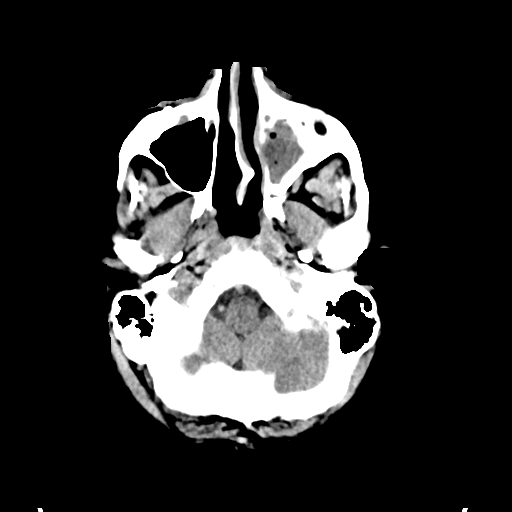
[im 6/36  bone]
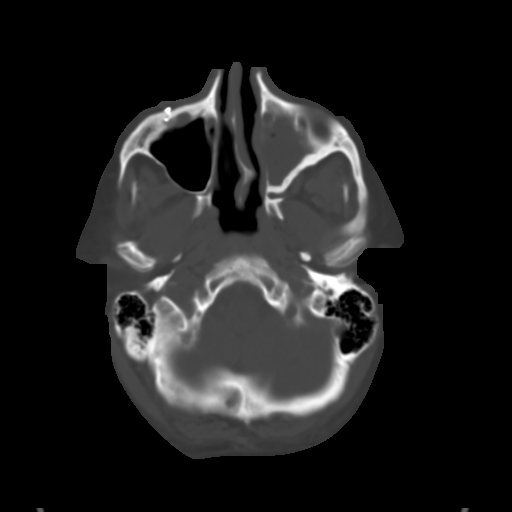
[im 11/36  brain]
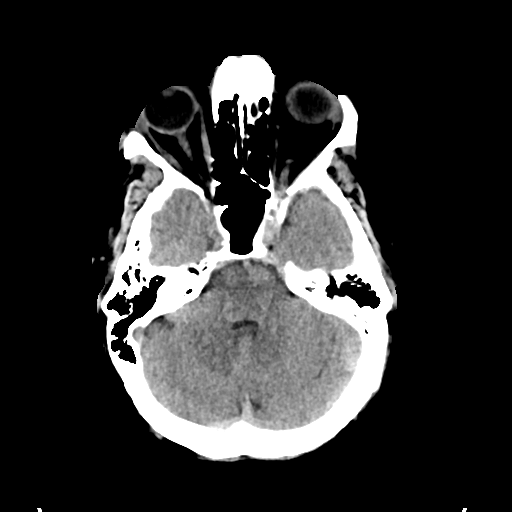
[im 16/36  brain]
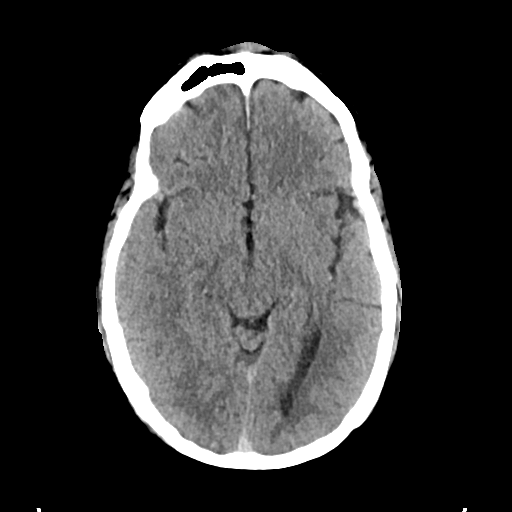
[im 21/36  brain]
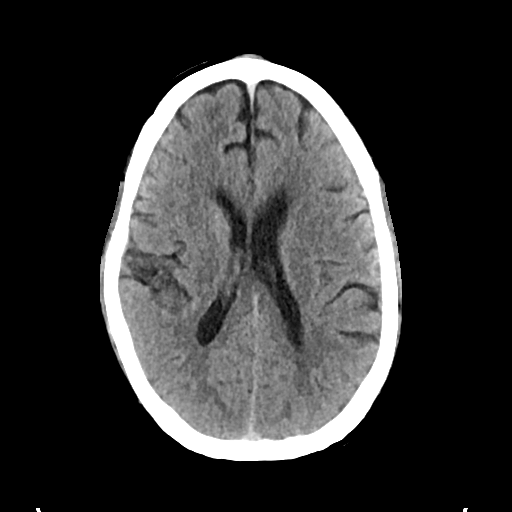
[im 26/36  brain]
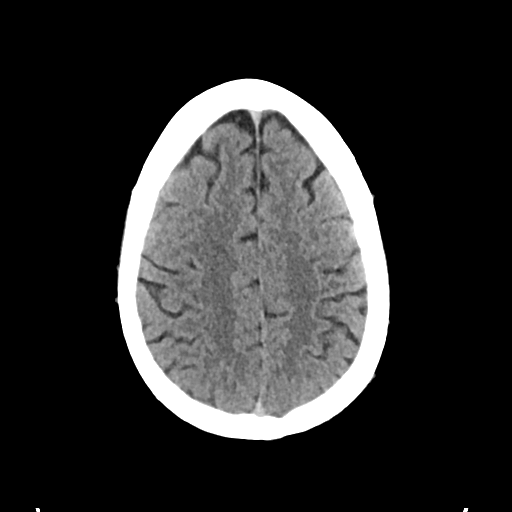
[im 26/36  bone]
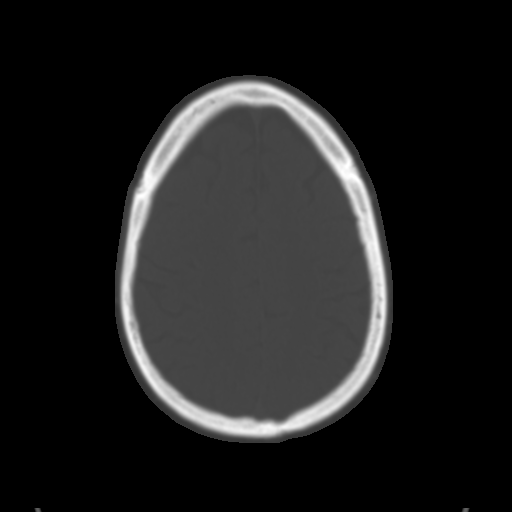
[im 31/36  brain]
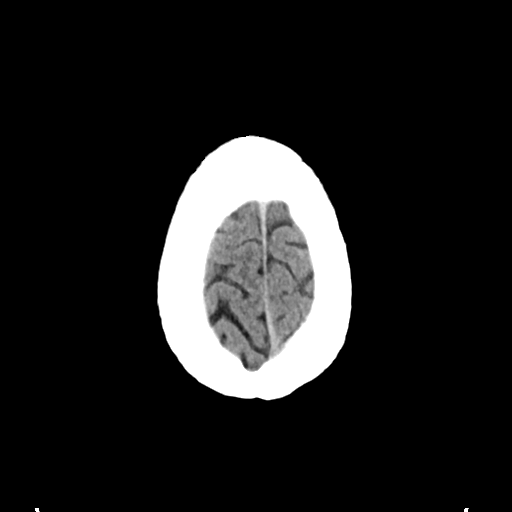

[Series 3: head bone · axial · 0.47mm/px · z∈[-88,-26]mm · 4 of 93 slices shown]
[im 9/93  bone]
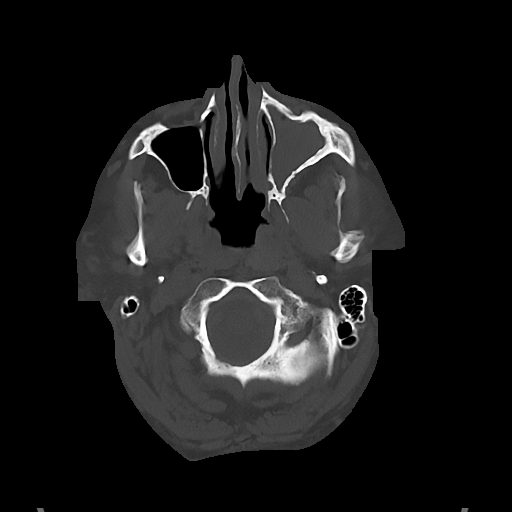
[im 18/93  bone]
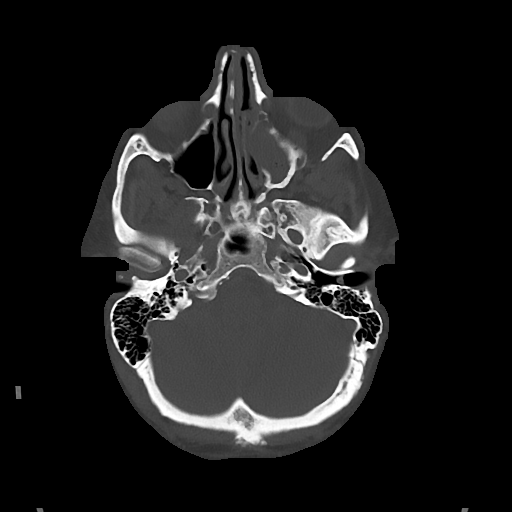
[im 31/93  bone]
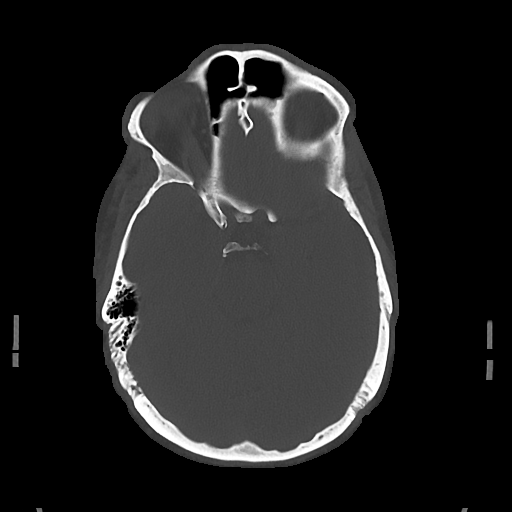
[im 40/93  bone]
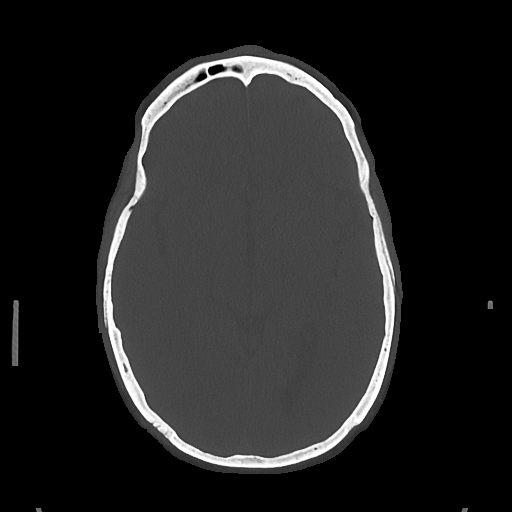

[Series 4: cor soft · coronal · 0.35mm/px · 3 of 81 slices shown]
[im 27/81  brain]
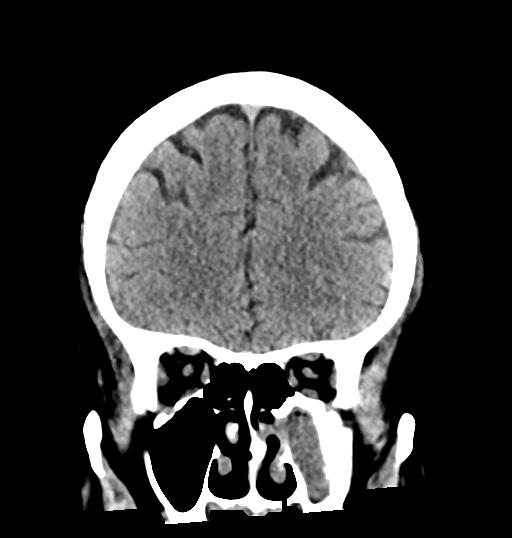
[im 36/81  brain]
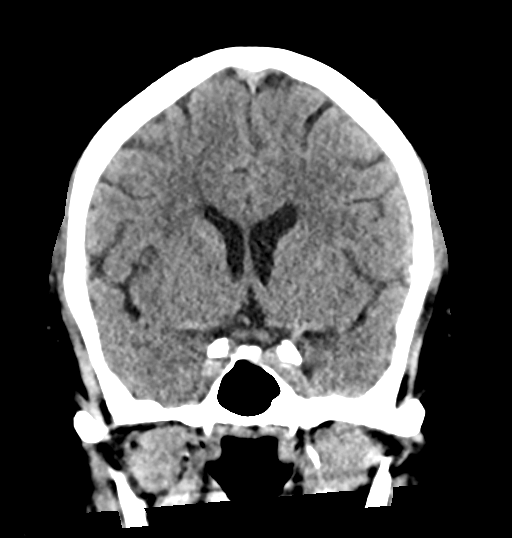
[im 45/81  brain]
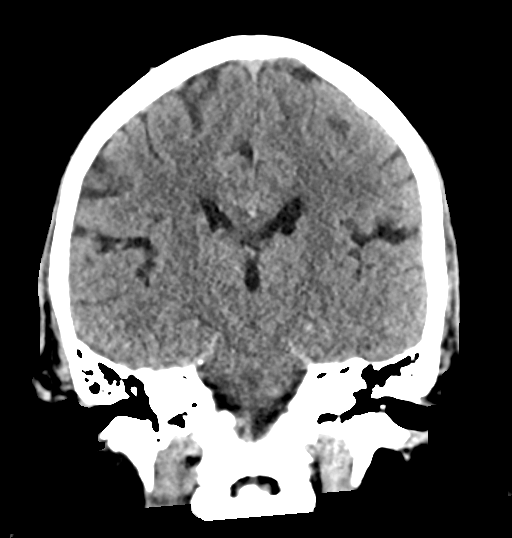

[Series 5: sag soft · sagittal · 0.37mm/px · 3 of 65 slices shown]
[im 22/65  brain]
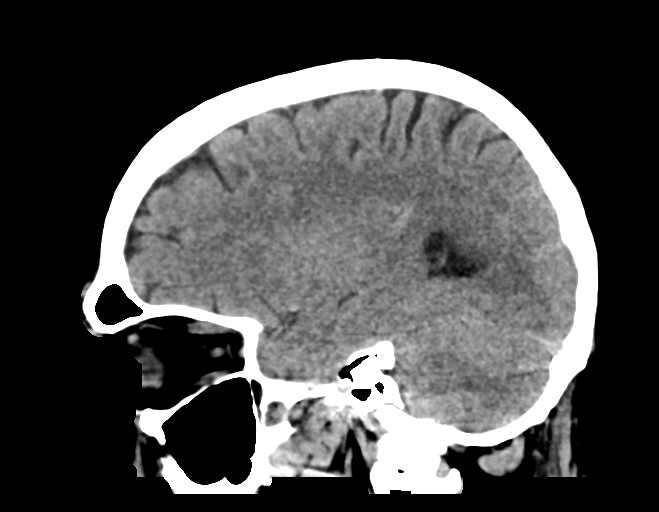
[im 33/65  brain]
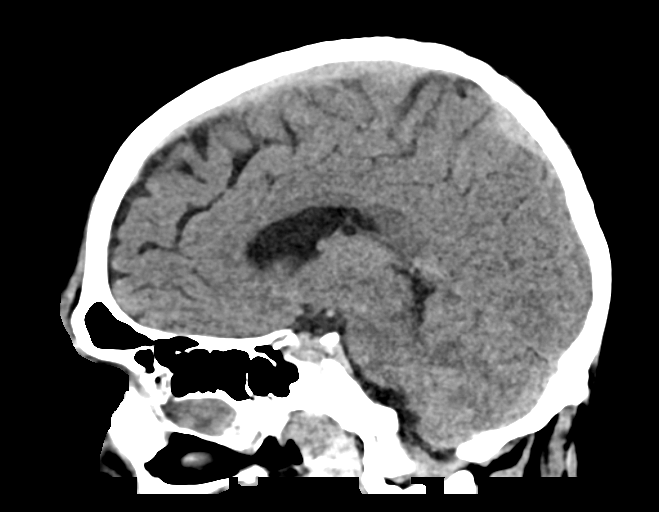
[im 43/65  brain]
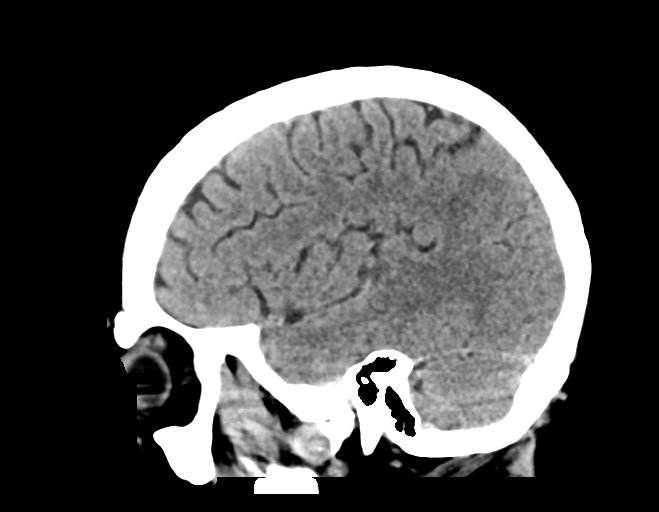

[16 of 47 positions shown; findings below may reference images not displayed]

FINDINGS: Brain: No evidence of acute infarction, hemorrhage, hydrocephalus,
extra-axial collection or mass lesion/mass effect.

Vascular: No hyperdense vessel or unexpected calcification.

Skull: Intact.  No focal lesion.

Sinuses/Orbits: Status post fixation of the anterior wall of the
right maxillary sinus. The left maxillary sinus is almost completely
opacified with thickening of its walls.

Other: Soft tissue contusion midline of the forehead is identified.
IMPRESSION: Soft tissue contusion on the forehead without underlying fracture or
acute intracranial abnormality.

Chronic left maxillary sinus disease.

## 2021-03-09 ENCOUNTER — Encounter: Payer: Self-pay | Admitting: Gastroenterology

## 2021-03-13 ENCOUNTER — Telehealth: Payer: Self-pay | Admitting: Family Medicine

## 2021-03-13 DIAGNOSIS — Z125 Encounter for screening for malignant neoplasm of prostate: Secondary | ICD-10-CM

## 2021-03-13 DIAGNOSIS — E7800 Pure hypercholesterolemia, unspecified: Secondary | ICD-10-CM

## 2021-03-13 NOTE — Telephone Encounter (Signed)
-----   Message from Ellamae Sia sent at 03/09/2021  3:14 PM EST ----- Regarding: Lab orders for Tuesday, 12.20.22 Patient is scheduled for CPX labs, please order future labs, Thanks , Karna Christmas

## 2021-03-17 ENCOUNTER — Other Ambulatory Visit: Payer: Medicare Other

## 2021-03-24 ENCOUNTER — Encounter: Payer: Medicare Other | Admitting: Family Medicine

## 2021-03-26 DIAGNOSIS — G4733 Obstructive sleep apnea (adult) (pediatric): Secondary | ICD-10-CM | POA: Diagnosis not present

## 2021-04-10 DIAGNOSIS — J449 Chronic obstructive pulmonary disease, unspecified: Secondary | ICD-10-CM | POA: Diagnosis not present

## 2021-04-10 DIAGNOSIS — G4733 Obstructive sleep apnea (adult) (pediatric): Secondary | ICD-10-CM | POA: Diagnosis not present

## 2021-04-20 ENCOUNTER — Other Ambulatory Visit: Payer: Self-pay | Admitting: Family Medicine

## 2021-04-23 ENCOUNTER — Other Ambulatory Visit: Payer: Medicare Other

## 2021-04-24 ENCOUNTER — Other Ambulatory Visit: Payer: Self-pay | Admitting: Family Medicine

## 2021-04-30 ENCOUNTER — Encounter: Payer: Medicare Other | Admitting: Family Medicine

## 2021-05-15 ENCOUNTER — Encounter: Payer: Self-pay | Admitting: Family Medicine

## 2021-05-15 ENCOUNTER — Ambulatory Visit (INDEPENDENT_AMBULATORY_CARE_PROVIDER_SITE_OTHER): Payer: Medicare Other | Admitting: Family Medicine

## 2021-05-15 ENCOUNTER — Other Ambulatory Visit: Payer: Self-pay

## 2021-05-15 VITALS — BP 140/68 | HR 78 | Temp 97.6°F | Ht 68.25 in | Wt 220.4 lb

## 2021-05-15 DIAGNOSIS — Z125 Encounter for screening for malignant neoplasm of prostate: Secondary | ICD-10-CM

## 2021-05-15 DIAGNOSIS — E661 Drug-induced obesity: Secondary | ICD-10-CM

## 2021-05-15 DIAGNOSIS — E7800 Pure hypercholesterolemia, unspecified: Secondary | ICD-10-CM

## 2021-05-15 DIAGNOSIS — I10 Essential (primary) hypertension: Secondary | ICD-10-CM

## 2021-05-15 DIAGNOSIS — Z0000 Encounter for general adult medical examination without abnormal findings: Secondary | ICD-10-CM | POA: Diagnosis not present

## 2021-05-15 DIAGNOSIS — Z6833 Body mass index (BMI) 33.0-33.9, adult: Secondary | ICD-10-CM

## 2021-05-15 DIAGNOSIS — F209 Schizophrenia, unspecified: Secondary | ICD-10-CM

## 2021-05-15 LAB — COMPREHENSIVE METABOLIC PANEL WITH GFR
ALT: 17 U/L (ref 0–53)
AST: 17 U/L (ref 0–37)
Albumin: 4.8 g/dL (ref 3.5–5.2)
Alkaline Phosphatase: 103 U/L (ref 39–117)
BUN: 14 mg/dL (ref 6–23)
CO2: 31 meq/L (ref 19–32)
Calcium: 10 mg/dL (ref 8.4–10.5)
Chloride: 93 meq/L — ABNORMAL LOW (ref 96–112)
Creatinine, Ser: 1.24 mg/dL (ref 0.40–1.50)
GFR: 61.3 mL/min
Glucose, Bld: 96 mg/dL (ref 70–99)
Potassium: 4.6 meq/L (ref 3.5–5.1)
Sodium: 129 meq/L — ABNORMAL LOW (ref 135–145)
Total Bilirubin: 0.7 mg/dL (ref 0.2–1.2)
Total Protein: 7.3 g/dL (ref 6.0–8.3)

## 2021-05-15 LAB — LIPID PANEL
Cholesterol: 152 mg/dL (ref 0–200)
HDL: 53.6 mg/dL
LDL Cholesterol: 79 mg/dL (ref 0–99)
NonHDL: 98.34
Total CHOL/HDL Ratio: 3
Triglycerides: 95 mg/dL (ref 0.0–149.0)
VLDL: 19 mg/dL (ref 0.0–40.0)

## 2021-05-15 LAB — PSA, MEDICARE: PSA: 0.86 ng/mL (ref 0.10–4.00)

## 2021-05-15 NOTE — Assessment & Plan Note (Signed)
Associated with HTN and sleep apnea.  Encouraged exercise, weight loss, healthy eating habits.

## 2021-05-15 NOTE — Patient Instructions (Addendum)
Please stop at the lab to have labs drawn. Work on [employer] and regular exercise.!  Quit smoking

## 2021-05-15 NOTE — Assessment & Plan Note (Signed)
Stable, chronic.  Continue current medication.   Lisinopril 10 mg daily   

## 2021-05-15 NOTE — Progress Notes (Signed)
Patient ID: Carl Contreras, male    DOB: 1956-02-07, 66 y.o.   MRN: [id number]  This visit was conducted in person.  BP 140/68    Pulse 78    Temp 97.6 F (36.4 C) (Temporal)    Ht 5' 8.25" (1.734 m)    Wt 220 lb 6 oz (100 kg)    SpO2 96%    BMI 33.26 kg/m      CC: Chief Complaint  Patient presents with   Medicare Wellness    Subjective:   HPI: Carl Contreras is a 66 y.o. male presenting on 05/15/2021 for Medicare Wellness  The patient presents for annual medicare wellness, complete physical and review of chronic health problems. He/She also has the following acute concerns today: none  I have personally reviewed the Medicare Annual Wellness questionnaire and have noted 1. The patient's medical and social history 2. Their use of alcohol, tobacco or illicit drugs 3. Their current medications and supplements 4. The patient's functional ability including ADL's, fall risks, home safety risks and hearing or visual             impairment. 5. Diet and physical activities 6. Evidence for depression or mood disorders 7.         Updated provider list. Cognitive evaluation was performed and recorded on pt medicare questionnaire form. The patients weight, height, BMI and visual acuity have been recorded in the chart   I have made referrals, counseling and provided education to the patient based review of the above and I have provided the pt with a written personalized care plan for preventive services.  Documentation of this information was scanned into the electronic record under the media tab.   Advance directives and end of life planning reviewed in detail with patient and documented in EMR. Patient given handout on advance care directives if needed. HCPOA and living will updated if needed.  Hearing Screening   500Hz  1000Hz  2000Hz  4000Hz   Right ear 20 20 20  0  Left ear 20 20 20  0  Vision Screening - Comments:: Last eye exam within past yr.   No fall in the last year.  PHQ2:  0  Hypertension:   Borderline control on lisinopril 10 mg daily  Had a lot of caffeine this AM. BP Readings from Last 3 Encounters:  05/15/21 140/68  02/27/20 134/83  01/10/20 126/78  Using medication without problems or lightheadedness:  none Chest pain with exertion: none Edema:none Short of breath:none Average home BPs: not checking Other issues:  Elevated Cholesterol: due for re-eval on simvastatin 40 mg daily Using medications without problems: Muscle aches:  Diet compliance:heart healthy diet Exercise: walking  30 min daily Other complaints:  Wt Readings from Last 3 Encounters:  05/15/21 220 lb 6 oz (100 kg)  02/27/20 218 lb (98.9 kg)  02/11/20 218 lb (98.9 kg)      Schizophrenia followed by psych.     Relevant past medical, surgical, family and social history reviewed and updated as indicated. Interim medical history since our last visit reviewed. Allergies and medications reviewed and updated. Outpatient Medications Prior to Visit  Medication Sig Dispense Refill   benztropine (COGENTIN) 1 MG tablet Take 1 mg by mouth daily.     fluPHENAZine (PROLIXIN) 5 MG tablet Take 5 mg by mouth daily.      lisinopril (ZESTRIL) 10 MG tablet TAKE 1 TABLET BY MOUTH ONCE DAILY. (NEEDOFFICE VISIT FOR REFILLS) 90 tablet 0   Multiple Vitamin (MULTIVITAMIN) tablet Take  1 tablet by mouth daily.     Omega-3 Fatty Acids (OMEGA-3 FISH OIL) 1000 MG CAPS Take by mouth. Take 2 tablet 2 times daily     simvastatin (ZOCOR) 40 MG tablet TAKE 1 TABLET BY MOUTH ONCE DAILY 90 tablet 0   zinc gluconate 50 MG tablet Take 50 mg by mouth daily.      ziprasidone (GEODON) 60 MG capsule Take 60 mg by mouth daily.     No facility-administered medications prior to visit.     Per HPI unless specifically indicated in ROS section below Review of Systems  Constitutional:  Negative for fatigue and fever.  HENT:  Negative for ear pain.   Eyes:  Negative for pain.  Respiratory:  Negative for cough and  shortness of breath.   Cardiovascular:  Negative for chest pain, palpitations and leg swelling.  Gastrointestinal:  Negative for abdominal pain.  Genitourinary:  Negative for dysuria.  Musculoskeletal:  Negative for arthralgias.  Neurological:  Negative for syncope, light-headedness and headaches.  Psychiatric/Behavioral:  Negative for dysphoric mood.   Objective:  BP 140/68    Pulse 78    Temp 97.6 F (36.4 C) (Temporal)    Ht 5' 8.25" (1.734 m)    Wt 220 lb 6 oz (100 kg)    SpO2 96%    BMI 33.26 kg/m   Wt Readings from Last 3 Encounters:  05/15/21 220 lb 6 oz (100 kg)  02/27/20 218 lb (98.9 kg)  02/11/20 218 lb (98.9 kg)      Physical Exam Constitutional:      General: He is not in acute distress.    Appearance: Normal appearance. He is well-developed. He is obese. He is not ill-appearing or toxic-appearing.  HENT:     Head: Normocephalic and atraumatic.     Right Ear: Hearing, tympanic membrane, ear canal and external ear normal.     Left Ear: Hearing, tympanic membrane, ear canal and external ear normal.     Nose: Nose normal.     Mouth/Throat:     Pharynx: Uvula midline.  Eyes:     General: Lids are normal. Lids are everted, no foreign bodies appreciated.     Conjunctiva/sclera: Conjunctivae normal.     Pupils: Pupils are equal, round, and reactive to light.  Neck:     Thyroid: No thyroid mass or thyromegaly.     Vascular: No carotid bruit.     Trachea: Trachea and phonation normal.  Cardiovascular:     Rate and Rhythm: Normal rate and regular rhythm.     Pulses: Normal pulses.     Heart sounds: S1 normal and S2 normal. No murmur heard.   No gallop.  Pulmonary:     Breath sounds: Normal breath sounds. No wheezing, rhonchi or rales.  Abdominal:     General: Bowel sounds are normal.     Palpations: Abdomen is soft.     Tenderness: There is no abdominal tenderness. There is no guarding or rebound.     Hernia: No hernia is present.  Musculoskeletal:     Cervical  back: Normal range of motion and neck supple.  Lymphadenopathy:     Cervical: No cervical adenopathy.  Skin:    General: Skin is warm and dry.     Findings: No rash.  Neurological:     Mental Status: He is alert.     Cranial Nerves: No cranial nerve deficit.     Sensory: No sensory deficit.     Gait: Gait normal.  Deep Tendon Reflexes: Reflexes are normal and symmetric.  Psychiatric:        Speech: Speech normal.        Behavior: Behavior normal.        Judgment: Judgment normal.      Results for orders placed or performed in visit on 01/10/20  PSA, Medicare  Result Value Ref Range   PSA 1.02 0.10 - 4.00 ng/ml    This visit occurred during the SARS-CoV-2 public health emergency.  Safety protocols were in place, including screening questions prior to the visit, additional usage of staff PPE, and extensive cleaning of exam room while observing appropriate contact time as indicated for disinfecting solutions.   COVID 19 screen:  No recent travel or known exposure to COVID19 The patient denies respiratory symptoms of COVID 19 at this time. The importance of social distancing was discussed today.   Assessment and Plan The patient's preventative maintenance and recommended screening tests for an annual wellness exam were reviewed in full today. Brought up to date unless services declined.  Counselled on the importance of diet, exercise, and its role in overall health and mortality. The patient's FH and SH was reviewed, including their home life, tobacco status, and drug and alcohol status.   Vaccines: refused flu,Td, shingrix S/P COVID19 x  3 per pt. Prostate Cancer Screen: PSA repeat due Colon Cancer Screen:  02/2020 Dr. Fuller Plan, repeat in 3 years.. due  NOW      Smoking Status: daily smoker.. pre-contemplative.. has decreased to 1/2 pack per day. ETOH/ drug use: none/none  Hep C:  negative  HIV screen:  refused  Plan Lung cancer CT program after age 40.  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  Encouraged exercise, weight loss, healthy eating habits.       Essential hypertension, benign    Stable, chronic.  Continue current medication.   Lisinopril 10 mg daily      PURE HYPERCHOLESTEROLEMIA    Due for re-eval on simvastatin 40 mg daily. Pt is compliant.      Schizophrenia ([place])    Stable per pt on current regimen.  Followed closely by psychiatry.      Other Visit Diagnoses     Medicare annual wellness visit, subsequent    -  Primary   Prostate cancer screening              Eliezer Lofts, MD

## 2021-05-15 NOTE — Assessment & Plan Note (Signed)
Stable per pt on current regimen.  Followed closely by psychiatry. 

## 2021-05-15 NOTE — Assessment & Plan Note (Signed)
Due for re-eval on simvastatin 40 mg daily. Pt is compliant.

## 2021-05-20 ENCOUNTER — Other Ambulatory Visit: Payer: Self-pay | Admitting: Family Medicine

## 2021-05-20 DIAGNOSIS — E871 Hypo-osmolality and hyponatremia: Secondary | ICD-10-CM

## 2021-05-25 ENCOUNTER — Other Ambulatory Visit: Payer: Self-pay

## 2021-05-25 ENCOUNTER — Other Ambulatory Visit (INDEPENDENT_AMBULATORY_CARE_PROVIDER_SITE_OTHER): Payer: Medicare Other

## 2021-05-25 DIAGNOSIS — E871 Hypo-osmolality and hyponatremia: Secondary | ICD-10-CM | POA: Diagnosis not present

## 2021-05-25 LAB — BASIC METABOLIC PANEL
BUN: 14 mg/dL (ref 6–23)
CO2: 27 mEq/L (ref 19–32)
Calcium: 9.6 mg/dL (ref 8.4–10.5)
Chloride: 98 mEq/L (ref 96–112)
Creatinine, Ser: 1.33 mg/dL (ref 0.40–1.50)
GFR: 56.35 mL/min — ABNORMAL LOW (ref >60.00)
Glucose, Bld: 91 mg/dL (ref 70–99)
Potassium: 4.2 mEq/L (ref 3.5–5.1)
Sodium: 133 mEq/L — ABNORMAL LOW (ref 135–145)

## 2021-07-09 DIAGNOSIS — G4733 Obstructive sleep apnea (adult) (pediatric): Secondary | ICD-10-CM | POA: Diagnosis not present

## 2021-07-29 ENCOUNTER — Other Ambulatory Visit: Payer: Self-pay | Admitting: Family Medicine

## 2021-08-05 ENCOUNTER — Other Ambulatory Visit: Payer: Self-pay | Admitting: Family Medicine

## 2021-11-05 DIAGNOSIS — G4733 Obstructive sleep apnea (adult) (pediatric): Secondary | ICD-10-CM | POA: Diagnosis not present

## 2022-02-04 DIAGNOSIS — G4733 Obstructive sleep apnea (adult) (pediatric): Secondary | ICD-10-CM | POA: Diagnosis not present

## 2022-05-03 ENCOUNTER — Telehealth: Payer: Self-pay | Admitting: Internal Medicine

## 2022-05-03 NOTE — Telephone Encounter (Signed)
Can we set this patient up with a new patient appointment for Dr Jenetta Downer.   Thank you

## 2022-05-03 NOTE — Telephone Encounter (Signed)
Ok with me 

## 2022-05-03 NOTE — Telephone Encounter (Signed)
Okay with me 

## 2022-05-03 NOTE — Telephone Encounter (Signed)
Dr. Annamaria Boots and Dr. Ander Slade  Are you okay with patient changing providers. Please advise?

## 2022-05-05 NOTE — Telephone Encounter (Signed)
ATC X1 LVM for patient to call the office back 

## 2022-05-06 NOTE — Telephone Encounter (Signed)
Patient has been scheduled with Dr. Ander Slade. Nothing further needed

## 2022-05-11 ENCOUNTER — Ambulatory Visit: Payer: Medicare Other

## 2022-05-11 ENCOUNTER — Telehealth: Payer: Self-pay | Admitting: Family Medicine

## 2022-05-11 ENCOUNTER — Other Ambulatory Visit (INDEPENDENT_AMBULATORY_CARE_PROVIDER_SITE_OTHER): Payer: Medicare Other

## 2022-05-11 DIAGNOSIS — Z125 Encounter for screening for malignant neoplasm of prostate: Secondary | ICD-10-CM

## 2022-05-11 DIAGNOSIS — E7800 Pure hypercholesterolemia, unspecified: Secondary | ICD-10-CM

## 2022-05-11 LAB — LIPID PANEL
Cholesterol: 141 mg/dL (ref 0–200)
HDL: 46.6 mg/dL (ref >39.00)
LDL Cholesterol: 66 mg/dL (ref 0–99)
NonHDL: 94.87
Total CHOL/HDL Ratio: 3
Triglycerides: 144 mg/dL (ref 0.0–149.0)
VLDL: 28.8 mg/dL (ref 0.0–40.0)

## 2022-05-11 LAB — COMPREHENSIVE METABOLIC PANEL
ALT: 13 U/L (ref 0–53)
AST: 16 U/L (ref 0–37)
Albumin: 4.3 g/dL (ref 3.5–5.2)
Alkaline Phosphatase: 85 U/L (ref 39–117)
BUN: 16 mg/dL (ref 6–23)
CO2: 29 mEq/L (ref 19–32)
Calcium: 9.8 mg/dL (ref 8.4–10.5)
Chloride: 97 mEq/L (ref 96–112)
Creatinine, Ser: 1.38 mg/dL (ref 0.40–1.50)
GFR: 53.54 mL/min — ABNORMAL LOW (ref >60.00)
Glucose, Bld: 101 mg/dL — ABNORMAL HIGH (ref 70–99)
Potassium: 4.6 mEq/L (ref 3.5–5.1)
Sodium: 132 mEq/L — ABNORMAL LOW (ref 135–145)
Total Bilirubin: 0.4 mg/dL (ref 0.2–1.2)
Total Protein: 6.7 g/dL (ref 6.0–8.3)

## 2022-05-11 LAB — PSA: PSA: 1.51 ng/mL (ref 0.10–4.00)

## 2022-05-11 NOTE — Progress Notes (Signed)
No critical labs need to be addressed urgently. We will discuss labs in detail at upcoming office visit.   

## 2022-05-11 NOTE — Telephone Encounter (Signed)
-----   Message from Ellamae Sia sent at 05/11/2022  8:34 AM EST ----- Regarding: lab orders for now Lab orders, thanks

## 2022-05-18 ENCOUNTER — Ambulatory Visit (INDEPENDENT_AMBULATORY_CARE_PROVIDER_SITE_OTHER): Payer: Medicare Other

## 2022-05-18 ENCOUNTER — Ambulatory Visit (INDEPENDENT_AMBULATORY_CARE_PROVIDER_SITE_OTHER): Payer: Medicare Other | Admitting: Family Medicine

## 2022-05-18 ENCOUNTER — Encounter: Payer: Self-pay | Admitting: Family Medicine

## 2022-05-18 VITALS — BP 130/70 | HR 71 | Temp 97.9°F | Ht 68.5 in | Wt 223.5 lb

## 2022-05-18 VITALS — Ht 68.0 in | Wt 220.0 lb

## 2022-05-18 DIAGNOSIS — I10 Essential (primary) hypertension: Secondary | ICD-10-CM

## 2022-05-18 DIAGNOSIS — E661 Drug-induced obesity: Secondary | ICD-10-CM | POA: Diagnosis not present

## 2022-05-18 DIAGNOSIS — Z136 Encounter for screening for cardiovascular disorders: Secondary | ICD-10-CM

## 2022-05-18 DIAGNOSIS — E7800 Pure hypercholesterolemia, unspecified: Secondary | ICD-10-CM

## 2022-05-18 DIAGNOSIS — G4733 Obstructive sleep apnea (adult) (pediatric): Secondary | ICD-10-CM | POA: Diagnosis not present

## 2022-05-18 DIAGNOSIS — Z6833 Body mass index (BMI) 33.0-33.9, adult: Secondary | ICD-10-CM

## 2022-05-18 DIAGNOSIS — Z1211 Encounter for screening for malignant neoplasm of colon: Secondary | ICD-10-CM

## 2022-05-18 DIAGNOSIS — Z720 Tobacco use: Secondary | ICD-10-CM

## 2022-05-18 DIAGNOSIS — F209 Schizophrenia, unspecified: Secondary | ICD-10-CM | POA: Diagnosis not present

## 2022-05-18 DIAGNOSIS — Z0000 Encounter for general adult medical examination without abnormal findings: Secondary | ICD-10-CM

## 2022-05-18 NOTE — Assessment & Plan Note (Signed)
Stable, chronic.  Continue current medication.   Simvastatin 40 mg daily 

## 2022-05-18 NOTE — Progress Notes (Signed)
I connected with  Carl Contreras on 05/18/22 by a audio enabled telemedicine application and verified that I am speaking with the correct person using two identifiers.  Patient Location: Home  Provider Location: Home Office  I discussed the limitations of evaluation and management by telemedicine. The patient expressed understanding and agreed to proceed.  Subjective:   Carl Contreras is a 67 y.o. male who presents for Medicare Annual/Subsequent preventive examination.  Review of Systems     Cardiac Risk Factors include: advanced age (>46mn, >>99women);hypertension;male gender     Objective:    Today's Vitals   05/18/22 0803  Weight: 220 lb (99.8 kg)  Height: 5' 8"$  (1.727 m)   Body mass index is 33.45 kg/m.     05/18/2022    8:19 AM 08/18/2018    2:24 PM 08/11/2018    9:13 PM 08/02/2018    9:30 AM 10/30/2015   11:08 AM 06/06/2014    8:29 AM  Advanced Directives  Does Patient Have a Medical Advance Directive? No No No Yes Yes Yes  Type of AScientist, research (medical)Living will [place]in Chart?    No - copy requested No - copy requested   Would patient like information on creating a medical advance directive? No - Patient declined  No - Guardian declined       Current Medications (verified) Outpatient Encounter Medications as of 05/18/2022  Medication Sig   benztropine (COGENTIN) 1 MG tablet Take 1 mg by mouth daily.   fluPHENAZine (PROLIXIN) 5 MG tablet Take 5 mg by mouth daily.    lisinopril (ZESTRIL) 10 MG tablet TAKE 1 TABLET BY MOUTH ONCE DAILY. (NEEDOFFICE VISIT FOR REFILLS)   Multiple Vitamin (MULTIVITAMIN) tablet Take 1 tablet by mouth daily.   Omega-3 Fatty Acids (OMEGA-3 FISH OIL) 1000 MG CAPS Take by mouth. Take 2 tablet 2 times daily   simvastatin (ZOCOR) 40 MG tablet TAKE 1 TABLET BY MOUTH ONCE DAILY   zinc gluconate 50 MG tablet Take 50  mg by mouth daily.    ziprasidone (GEODON) 60 MG capsule Take 60 mg by mouth daily.   No facility-administered encounter medications on file as of 05/18/2022.    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   Sleep apnea    wears CPAP   Past Surgical History:  Procedure Laterality Date   COLONOSCOPY     FACIAL FRACTURE SURGERY     in college   TONSILLECTOMY     Family History  Problem Relation Age of Onset   Pancreatitis Father    Gallbladder disease Other    Stroke Mother    Colon cancer Neg Hx    Esophageal cancer Neg Hx    Rectal cancer Neg Hx    Stomach cancer Neg Hx    Social History   Socioeconomic History   Marital status: Single    Spouse name: Not on file   Number of children: 0   Years of education: Not on file   Highest education level: Not on  file  Occupational History   Occupation: professional musician  Tobacco Use   Smoking status: Every Day    Packs/day: 1.00    Years: 40.00    Total pack years: 40.00    Types: Cigarettes    Start date: 03/29/1976   Smokeless tobacco: Former   Tobacco comments:    down to 1/2 pack/day  Vaping Use   Vaping Use: Never used  Substance and Sexual Activity   Alcohol use: Yes    Alcohol/week: 6.0 - 8.0 standard drinks of alcohol    Types: 6 - 8 Standard drinks or equivalent per week    Comment: once in a while    Drug use: No   Sexual activity: Not Currently  Other Topics Concern   Not on file  Social History Narrative   No living will, HCPOA: mother Carl Contreras      Lives alone in a one story home. Has a cat      Right hand      Highest level of edu- BA College   Social Determinants of Health    Financial Resource Strain: Medium Risk (05/18/2022)   Overall Financial Resource Strain (CARDIA)    Difficulty of Paying Living Expenses: Somewhat hard  Food Insecurity: No Food Insecurity (05/18/2022)   Hunger Vital Sign    Worried About Running Out of Food in the Last Year: Never true    Ran Out of Food in the Last Year: Never true  Transportation Needs: No Transportation Needs (05/18/2022)   PRAPARE - Hydrologist (Medical): No    Lack of Transportation (Non-Medical): No  Physical Activity: Inactive (05/18/2022)   Exercise Vital Sign    Days of Exercise per Week: 0 days    Minutes of Exercise per Session: 0 min  Stress: No Stress Concern Present (05/18/2022)   Carl Contreras    Feeling of Stress : Not at all  Social Connections: Moderately Isolated (05/18/2022)   Social Connection and Isolation Panel [NHANES]    Frequency of Communication with Friends and Family: More than three times a week    Frequency of Social Gatherings with Friends and Family: More than three times a week    Attends Religious Services: Never    Marine scientist or Organizations: Yes    Attends Music therapist: More than 4 times per year    Marital Status: Divorced    Tobacco Counseling Ready to quit: Yes Counseling given: Yes Tobacco comments: down to 1/2 pack/day   Clinical Intake:  Pre-visit preparation completed: Yes  Pain : No/denies pain     Nutritional Risks: None Diabetes: No  How often do you need to have someone help you when you read instructions, pamphlets, or other written materials from your doctor or pharmacy?: 1 - Never  Diabetic? no  Interpreter Needed?: No  Information entered by :: Carl Shorb LPN   Activities of Daily Living    05/18/2022    8:20 AM  In your present state of health, do you have any difficulty performing the following activities:  Hearing? 0   Vision? 0  Difficulty concentrating or making decisions? 0  Walking or climbing stairs? 0  Dressing or bathing? 0  Doing errands, shopping? 0  Preparing Food and eating ? N  Using the Toilet? N  In the past six months, have you accidently leaked urine? N  Do you have problems with loss of bowel control?  N  Managing your Medications? N  Managing your Finances? Y  Comment Accountant pays bills  Housekeeping or managing your Housekeeping? N    Patient Care Team: Jinny Sanders, MD as PCP - General  Indicate any recent Medical Services you may have received from other than [facility] providers in the past year (date may be approximate).     Assessment:   This is a routine wellness examination for Carl Contreras.  Hearing/Vision screen Hearing Screening - Comments:: No aids Vision Screening - Comments:: Readers - Does not have Eye Dr.  Annette Stable issues and exercise activities discussed: Current Exercise Habits: The patient does not participate in regular exercise at present, Exercise limited by: psychological condition(s)   Goals Addressed             This Visit's Progress    Patient Stated       Be 50% better.       Depression Screen    05/18/2022    8:13 AM 01/10/2020   11:28 AM 08/02/2018    9:20 AM 01/27/2017    2:07 PM 10/30/2015   10:16 AM  PHQ 2/9 Scores  PHQ - 2 Score 0 0 0 0 0  PHQ- 9 Score  0 0      Fall Risk    05/18/2022    8:07 AM 05/15/2021   11:52 AM 01/10/2020   11:22 AM 04/18/2019   10:54 AM 10/18/2018    8:38 AM  Fall Risk   Falls in the past year? 1 0 0 0 1  Number falls in past yr: 1  0 0 0  Comment Misses chair when sitting      Injury with Fall? 0  0 0 1  Comment     Fell face first in gravel during seizure  Follow up Falls prevention discussed;Falls evaluation completed   Falls evaluation completed Falls evaluation completed    FALL RISK PREVENTION PERTAINING TO THE HOME:  Any stairs in or around the home? Yes  If so, are there any without handrails?  No  Home free of loose throw rugs in walkways, pet beds, electrical cords, etc? Yes  Adequate lighting in your home to reduce risk of falls? Yes   ASSISTIVE DEVICES UTILIZED TO PREVENT FALLS:  Life alert? No  Use of a cane, walker or w/c? No  Grab bars in the bathroom? No  Shower chair or bench in shower? No  Elevated toilet seat or a handicapped toilet? No   Cognitive Function:    08/02/2018    9:21 AM  MMSE - Mini Mental State Exam  Orientation to time 5  Orientation to Place 5  Registration 3  Attention/ Calculation 0  Recall 3  Language- name 2 objects 0  Language- repeat 1  Language- follow 3 step command 0  Language- read & follow direction 0  Write a sentence 0  Copy design 0  Total score 17        05/18/2022    8:30 AM  6CIT Screen  What Year? 0 points  What month? 0 points  What time? 0 points  Count back from 20 0 points  Repeat phrase 4 points    Immunizations Immunization History  Administered Date(s) Administered   PFIZER(Purple Top)SARS-COV-2 Vaccination 06/08/2019, 07/04/2019, 01/03/2020   Td 03/29/1998, 06/17/2009    TDAP status: Due, Education has been provided regarding the importance of this vaccine. Advised may receive this vaccine at local pharmacy or Health Dept. Aware to provide a copy  of the vaccination record if obtained from local pharmacy or Health Dept. Verbalized acceptance and understanding.  Flu Vaccine status: Declined, Education has been provided regarding the importance of this vaccine but patient still declined. Advised may receive this vaccine at local pharmacy or Health Dept. Aware to provide a copy of the vaccination record if obtained from local pharmacy or Health Dept. Verbalized acceptance and understanding.  Pneumococcal vaccine status: Declined,  Education has been provided regarding the importance of this vaccine but patient still declined. Advised may receive this vaccine at local pharmacy or Health Dept. Aware to provide  a copy of the vaccination record if obtained from local pharmacy or Health Dept. Verbalized acceptance and understanding.   Covid-19 vaccine status: Declined, Education has been provided regarding the importance of this vaccine but patient still declined. Advised may receive this vaccine at local pharmacy or Health Dept.or vaccine clinic. Aware to provide a copy of the vaccination record if obtained from local pharmacy or Health Dept. Verbalized acceptance and understanding.  Qualifies for Shingles Vaccine? Yes   Zostavax completed No   Shingrix Completed?: No.    Education has been provided regarding the importance of this vaccine. Patient has been advised to call insurance company to determine out of pocket expense if they have not yet received this vaccine. Advised may also receive vaccine at local pharmacy or Health Dept. Verbalized acceptance and understanding.  Screening Tests Health Maintenance  Topic Date Due   Pneumonia Vaccine 68+ Years old (1 of 2 - PCV) Never done   HIV Screening  Never done   Lung Cancer Screening  Never done   Zoster Vaccines- Shingrix (1 of 2) Never done   DTaP/Tdap/Td (3 - Tdap) 06/18/2019   INFLUENZA VACCINE  Never done   COVID-19 Vaccine (4 - 2023-24 season) 11/27/2021   COLONOSCOPY (Pts 45-53yr Insurance coverage will need to be confirmed)  02/27/2023   Medicare Annual Wellness (AWV)  05/19/2023   Hepatitis C Screening  Completed   HPV VACCINES  Aged Out    Health Maintenance  Health Maintenance Due  Topic Date Due   Pneumonia Vaccine 66 Years old (1 of 2 - PCV) Never done   HIV Screening  Never done   Lung Cancer Screening  Never done   Zoster Vaccines- Shingrix (1 of 2) Never done   DTaP/Tdap/Td (3 - Tdap) 06/18/2019   INFLUENZA VACCINE  Never done   COVID-19 Vaccine (4 - 2023-24 season) 11/27/2021    Colorectal cancer screening: Type of screening: Colonoscopy. Completed 02/27/2020. Repeat every 3 years ordered today.  Lung Cancer  Screening: (Low Dose CT Chest recommended if Age 66-80years, 30 pack-year currently smoking OR have quit w/in 15years.) does qualify.   Lung Cancer Screening Referral: Refused  Additional Screening:  Hepatitis C Screening: does not qualify; Completed 01/27/2017  Vision Screening: Recommended annual ophthalmology exams for early detection of glaucoma and other disorders of the eye. Is the patient up to date with their annual eye exam?  No  Who is the provider or what is the name of the office in which the patient attends annual eye exams? no If pt is not established with a provider, would they like to be referred to a provider to establish care? No .   Dental Screening: Recommended annual dental exams for proper oral hygiene  Community Resource Referral / Chronic Care Management: CRR required this visit?  No   CCM required this visit?  No      Plan:  I have personally reviewed and noted the following in the patient's chart:   Medical and social history Use of alcohol, tobacco or illicit drugs  Current medications and supplements including opioid prescriptions. Patient is not currently taking opioid prescriptions. Functional ability and status Nutritional status Physical activity Advanced directives List of other physicians Hospitalizations, surgeries, and ER visits in previous 12 months Vitals Screenings to include cognitive, depression, and falls Referrals and appointments  In addition, I have reviewed and discussed with patient certain preventive protocols, quality metrics, and best practice recommendations. A written personalized care plan for preventive services as well as general preventive health recommendations were provided to patient.     Lebron Conners, LPN   [id number]   Nurse Notes: Vaccinations: declines all Influenza vaccine: recommend every Fall Pneumococcal vaccine: recommend once per lifetime Prevnar-20 Tdap vaccine: recommend every 10  years Shingles vaccine: recommend Shingrix which is 2 doses 2-6 months apart and over 90% effective     Covid-19: recommend 2 doses one month apart with a booster 6 months later  Order placed for colonoscopy. Pt declined lung cancer screening.

## 2022-05-18 NOTE — Assessment & Plan Note (Signed)
Encouraged exercise, weight loss, healthy eating habits. ? ?

## 2022-05-18 NOTE — Progress Notes (Signed)
Patient ID: Carl Contreras, male    DOB: 1955/11/12, 67 y.o.   MRN: [id number]  This visit was conducted in person.  BP 130/70   Pulse 71   Temp 97.9 F (36.6 C) (Oral)   Ht 5' 8.5" (1.74 m)   Wt 223 lb 8 oz (101.4 kg)   SpO2 98%   BMI 33.49 kg/m      CC: Chief Complaint  Patient presents with   Annual Exam    Part 2     Subjective:   HPI: Carl Contreras is a 66 y.o. male presenting on 05/18/2022 for Annual Exam (Part 2 )  The patient presents for complete physical and review of chronic health problems. He/She also has the following acute concerns today: none  Flowsheet Row Clinical Support from 05/18/2022 in [place] [facility] Total Score 0       Hypertension:   Good control on lisinopril 10 mg daily.. BP Readings from Last 3 Encounters:  05/18/22 130/70  05/15/21 140/68  02/27/20 134/83  Using medication without problems or lightheadedness:  none Chest pain with exertion: none Edema:none Short of breath:none Average home BPs: not checking Other issues:  Elevated Cholesterol: On simvastatin 40 mg daily.. LDL at goal Lab Results  Component Value Date   CHOL 141 05/11/2022   HDL 46.60 05/11/2022   LDLCALC 66 05/11/2022   LDLDIRECT 72.7 06/11/2009   TRIG 144.0 05/11/2022   CHOLHDL 3 05/11/2022  Using medications without problems: Muscle aches:  Diet compliance:heart healthy diet Exercise: walking  30 min daily Other complaints:  Wt Readings from Last 3 Encounters:  05/18/22 223 lb 8 oz (101.4 kg)  05/18/22 220 lb (99.8 kg)  05/15/21 220 lb 6 oz (100 kg)     Schizophrenia followed by psych.     Relevant past medical, surgical, family and social history reviewed and updated as indicated. Interim medical history since our last visit reviewed. Allergies and medications reviewed and updated. Outpatient Medications Prior to Visit  Medication Sig Dispense Refill   benztropine (COGENTIN) 0.5 MG tablet Take 0.5  mg by mouth daily.     fluPHENAZine (PROLIXIN) 5 MG tablet Take 5 mg by mouth daily.      lisinopril (ZESTRIL) 10 MG tablet TAKE 1 TABLET BY MOUTH ONCE DAILY. (NEEDOFFICE VISIT FOR REFILLS) 90 tablet 3   Multiple Vitamin (MULTIVITAMIN) tablet Take 1 tablet by mouth daily.     Omega-3 Fatty Acids (OMEGA-3 FISH OIL) 1000 MG CAPS Take by mouth. Take 2 tablet 2 times daily     simvastatin (ZOCOR) 40 MG tablet TAKE 1 TABLET BY MOUTH ONCE DAILY 90 tablet 3   zinc gluconate 50 MG tablet Take 50 mg by mouth daily.      ziprasidone (GEODON) 60 MG capsule Take 60 mg by mouth daily.     benztropine (COGENTIN) 1 MG tablet Take 1 mg by mouth daily.     No facility-administered medications prior to visit.     Per HPI unless specifically indicated in ROS section below Review of Systems  Constitutional:  Negative for fatigue and fever.  HENT:  Negative for ear pain.   Eyes:  Negative for pain.  Respiratory:  Negative for cough and shortness of breath.   Cardiovascular:  Negative for chest pain, palpitations and leg swelling.  Gastrointestinal:  Negative for abdominal pain.  Genitourinary:  Negative for dysuria.  Musculoskeletal:  Negative for arthralgias.  Neurological:  Negative for syncope,  light-headedness and headaches.  Psychiatric/Behavioral:  Negative for dysphoric mood.    Objective:  BP 130/70   Pulse 71   Temp 97.9 F (36.6 C) (Oral)   Ht 5' 8.5" (1.74 m)   Wt 223 lb 8 oz (101.4 kg)   SpO2 98%   BMI 33.49 kg/m   Wt Readings from Last 3 Encounters:  05/18/22 223 lb 8 oz (101.4 kg)  05/18/22 220 lb (99.8 kg)  05/15/21 220 lb 6 oz (100 kg)      Physical Exam Constitutional:      General: He is not in acute distress.    Appearance: Normal appearance. He is well-developed. He is obese. He is not ill-appearing or toxic-appearing.  HENT:     Head: Normocephalic and atraumatic.     Right Ear: Hearing, tympanic membrane, ear canal and external ear normal.     Left Ear: Hearing,  tympanic membrane, ear canal and external ear normal.     Nose: Nose normal.     Mouth/Throat:     Pharynx: Uvula midline.  Eyes:     General: Lids are normal. Lids are everted, no foreign bodies appreciated.     Conjunctiva/sclera: Conjunctivae normal.     Pupils: Pupils are equal, round, and reactive to light.  Neck:     Thyroid: No thyroid mass or thyromegaly.     Vascular: No carotid bruit.     Trachea: Trachea and phonation normal.  Cardiovascular:     Rate and Rhythm: Normal rate and regular rhythm.     Pulses: Normal pulses.     Heart sounds: S1 normal and S2 normal. No murmur heard.    No gallop.  Pulmonary:     Breath sounds: Normal breath sounds. No wheezing, rhonchi or rales.  Abdominal:     General: Bowel sounds are normal.     Palpations: Abdomen is soft.     Tenderness: There is no abdominal tenderness. There is no guarding or rebound.     Hernia: No hernia is present.  Musculoskeletal:     Cervical back: Normal range of motion and neck supple.  Lymphadenopathy:     Cervical: No cervical adenopathy.  Skin:    General: Skin is warm and dry.     Findings: No rash.  Neurological:     Mental Status: He is alert.     Cranial Nerves: No cranial nerve deficit.     Sensory: No sensory deficit.     Gait: Gait normal.     Deep Tendon Reflexes: Reflexes are normal and symmetric.  Psychiatric:        Speech: Speech normal.        Behavior: Behavior normal.        Judgment: Judgment normal.       Results for orders placed or performed in visit on 05/11/22  Comprehensive metabolic panel  Result Value Ref Range   Sodium 132 (L) 135 - 145 mEq/L   Potassium 4.6 Slight Hemolysis 3.5 - 5.1 mEq/L   Chloride 97 96 - 112 mEq/L   CO2 29 19 - 32 mEq/L   Glucose, Bld 101 (H) 70 - 99 mg/dL   BUN 16 6 - 23 mg/dL   Creatinine, Ser 1.38 0.40 - 1.50 mg/dL   Total Bilirubin 0.4 0.2 - 1.2 mg/dL   Alkaline Phosphatase 85 39 - 117 U/L   AST 16 0 - 37 U/L   ALT 13 0 - 53 U/L    Total Protein 6.7 6.0 -  8.3 g/dL   Albumin 4.3 3.5 - 5.2 g/dL   GFR 53.54 (L) >60.00 mL/min   Calcium 9.8 8.4 - 10.5 mg/dL  PSA  Result Value Ref Range   PSA 1.51 0.10 - 4.00 ng/mL  Lipid panel  Result Value Ref Range   Cholesterol 141 0 - 200 mg/dL   Triglycerides 144.0 0.0 - 149.0 mg/dL   HDL 46.60 >39.00 mg/dL   VLDL 28.8 0.0 - 40.0 mg/dL   LDL Cholesterol 66 0 - 99 mg/dL   Total CHOL/HDL Ratio 3    NonHDL 94.87     This visit occurred during the SARS-CoV-2 public health emergency.  Safety protocols were in place, including screening questions prior to the visit, additional usage of staff PPE, and extensive cleaning of exam room while observing appropriate contact time as indicated for disinfecting solutions.   COVID 19 screen:  No recent travel or known exposure to COVID19 The patient denies respiratory symptoms of COVID 19 at this time. The importance of social distancing was discussed today.   Assessment and Plan The patient's preventative maintenance and recommended screening tests for an annual wellness exam were reviewed in full today. Brought up to date unless services declined.  Counselled on the importance of diet, exercise, and its role in overall health and mortality. The patient's FH and SH was reviewed, including their home life, tobacco status, and drug and alcohol status.   Vaccines: refused flu,Td, shingrix S/P COVID19 x  3 per pt. Now due for PNA vaccine.. pt refuses Prostate Cancer Screen:  Lab Results  Component Value Date   PSA 1.51 05/11/2022   PSA 0.86 05/15/2021   PSA 1.02 01/10/2020  Colon Cancer Screen:  02/2020 Dr. Fuller Plan, repeat in 3 years.. due  NOW      Smoking Status: daily smoker.. pre-contemplative.. has decreased to 1/2 pack per day. ETOH/ drug use: 2 drinks once a week or less/none  Hep C:  negative  HIV screen:  refused          Lung cancer CT program... discussed  but pt refuses.  AAA screening recommended.   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       Continuous tobacco abuse    Smoking cessation instruction/counseling given:  counseled patient on the dangers of tobacco use, advised patient to stop smoking, and reviewed strategies to maximize success  Referred to lung cancer screening program.      Essential hypertension, benign    Stable, chronic.  Continue current medication.   Lisinopril 10 mg daily      Obstructive sleep apnea     Followed by pulmonary on CPAP      Pure hypercholesterolemia    Stable, chronic.  Continue current medication.  Simvastatin 40 mg daily      Schizophrenia ([place])    Stable per pt on current regimen.  Followed closely by psychiatry.      Other Visit Diagnoses     Routine general medical examination at a health care facility    -  Primary   Encounter for abdominal aortic aneurysm (AAA) screening       Relevant Orders   [place] [facility] MEDICARE SCREENING         Eliezer Lofts, MD

## 2022-05-18 NOTE — Patient Instructions (Addendum)
Call if you change you mind about the lung cancer screening program.  We will call to set up the Abdominal aortic US.  Quit smoking! Think about the money you would save!  Work on heart healthy diet and increase exercise.   Call at the end of the year to schedule colonoscopy: [facility]  [phone]

## 2022-05-18 NOTE — Assessment & Plan Note (Signed)
Stable, chronic.  Continue current medication.   Lisinopril 10 mg daily   

## 2022-05-18 NOTE — Assessment & Plan Note (Signed)
Smoking cessation instruction/counseling given:  counseled patient on the dangers of tobacco use, advised patient to stop smoking, and reviewed strategies to maximize success  Referred to lung cancer screening program.

## 2022-05-18 NOTE — Assessment & Plan Note (Signed)
Followed by pulmonary on CPAP

## 2022-05-18 NOTE — Assessment & Plan Note (Signed)
Stable per pt on current regimen.  Followed closely by psychiatry.

## 2022-05-18 NOTE — Patient Instructions (Signed)
[name] , Thank you for taking time to come for your Medicare Wellness Visit. I appreciate your ongoing commitment to your health goals. Please review the following plan we discussed and let me know if I can assist you in the future.   These are the goals we discussed:  Goals      Patient Stated     Starting 08/02/2018, I will continue to take medications as prescribed.      Patient Stated     Be 50% better.        This is a list of the screening recommended for you and due dates:  Health Maintenance  Topic Date Due   Pneumonia Vaccine (1 of 2 - PCV) Never done   HIV Screening  Never done   Screening for Lung Cancer  Never done   Zoster (Shingles) Vaccine (1 of 2) Never done   DTaP/Tdap/Td vaccine (3 - Tdap) 06/18/2019   Flu Shot  Never done   COVID-19 Vaccine (4 - 2023-24 season) 11/27/2021   Colon Cancer Screening  02/27/2023   Medicare Annual Wellness Visit  05/19/2023   Hepatitis C Screening: USPSTF Recommendation to screen - Ages 18-79 yo.  Completed   HPV Vaccine  Aged Out    Advanced directives: Advance directive discussed with you today. Even though you declined this today, please call our office should you change your mind, and we can give you the proper paperwork for you to fill out.   Conditions/risks identified: Aim for 30 minutes of exercise or brisk walking, 6-8 glasses of water, and 5 servings of fruits and vegetables each day.   Next appointment: Follow up in one year for your annual wellness visit. 05/23/2023 @ 8:15am via telephone.  Preventive Care 4 Years and Older, Male  Preventive care refers to lifestyle choices and visits with your health care provider that can promote health and wellness. What does preventive care include? A yearly physical exam. This is also called an annual well check. Dental exams once or twice a year. Routine eye exams. Ask your health care provider how often you should have your eyes checked. Personal lifestyle choices,  including: Daily care of your teeth and gums. Regular physical activity. Eating a healthy diet. Avoiding tobacco and drug use. Limiting alcohol use. Practicing safe sex. Taking low doses of aspirin every day. Taking vitamin and mineral supplements as recommended by your health care provider. What happens during an annual well check? The services and screenings done by your health care provider during your annual well check will depend on your age, overall health, lifestyle risk factors, and family history of disease. Counseling  Your health care provider may ask you questions about your: Alcohol use. Tobacco use. Drug use. Emotional well-being. Home and relationship well-being. Sexual activity. Eating habits. History of falls. Memory and ability to understand (cognition). Work and work Statistician. Screening  You may have the following tests or measurements: Height, weight, and BMI. Blood pressure. Lipid and cholesterol levels. These may be checked every 5 years, or more frequently if you are over 62 years old. Skin check. Lung cancer screening. You may have this screening every year starting at age 66 if you have a 30-pack-year history of smoking and currently smoke or have quit within the past 15 years. Fecal occult blood test (FOBT) of the stool. You may have this test every year starting at age 66. Flexible sigmoidoscopy or colonoscopy. You may have a sigmoidoscopy every 5 years or a colonoscopy every 10  years starting at age 66. Prostate cancer screening. Recommendations will vary depending on your family history and other risks. Hepatitis C blood test. Hepatitis B blood test. Sexually transmitted disease (STD) testing. Diabetes screening. This is done by checking your blood sugar (glucose) after you have not eaten for a while (fasting). You may have this done every 1-3 years. Abdominal aortic aneurysm (AAA) screening. You may need this if you are a current or former  smoker. Osteoporosis. You may be screened starting at age 23 if you are at high risk. Talk with your health care provider about your test results, treatment options, and if necessary, the need for more tests. Vaccines  Your health care provider may recommend certain vaccines, such as: Influenza vaccine. This is recommended every year. Tetanus, diphtheria, and acellular pertussis (Tdap, Td) vaccine. You may need a Td booster every 10 years. Zoster vaccine. You may need this after age 38. Pneumococcal 13-valent conjugate (PCV13) vaccine. One dose is recommended after age 48. Pneumococcal polysaccharide (PPSV23) vaccine. One dose is recommended after age 23. Talk to your health care provider about which screenings and vaccines you need and how often you need them. This information is not intended to replace advice given to you by your health care provider. Make sure you discuss any questions you have with your health care provider. Document Released: 04/11/2015 Document Revised: 12/03/2015 Document Reviewed: 01/14/2015 Elsevier Interactive Patient Education  2017 [employer] Prevention in the Home Falls can cause injuries. They can happen to people of all ages. There are many things you can do to make your home safe and to help prevent falls. What can I do on the outside of my home? Regularly fix the edges of walkways and driveways and fix any cracks. Remove anything that might make you trip as you walk through a door, such as a raised step or threshold. Trim any bushes or trees on the path to your home. Use bright outdoor lighting. Clear any walking paths of anything that might make someone trip, such as rocks or tools. Regularly check to see if handrails are loose or broken. Make sure that both sides of any steps have handrails. Any raised decks and porches should have guardrails on the edges. Have any leaves, snow, or ice cleared regularly. Use sand or salt on walking paths during  winter. Clean up any spills in your garage right away. This includes oil or grease spills. What can I do in the bathroom? Use night lights. Install grab bars by the toilet and in the tub and shower. Do not use towel bars as grab bars. Use non-skid mats or decals in the tub or shower. If you need to sit down in the shower, use a plastic, non-slip stool. Keep the floor dry. Clean up any water that spills on the floor as soon as it happens. Remove soap buildup in the tub or shower regularly. Attach bath mats securely with double-sided non-slip rug tape. Do not have throw rugs and other things on the floor that can make you trip. What can I do in the bedroom? Use night lights. Make sure that you have a light by your bed that is easy to reach. Do not use any sheets or blankets that are too big for your bed. They should not hang down onto the floor. Have a firm chair that has side arms. You can use this for support while you get dressed. Do not have throw rugs and other things on the floor  that can make you trip. What can I do in the kitchen? Clean up any spills right away. Avoid walking on wet floors. Keep items that you use a lot in easy-to-reach places. If you need to reach something above you, use a strong step stool that has a grab bar. Keep electrical cords out of the way. Do not use floor polish or wax that makes floors slippery. If you must use wax, use non-skid floor wax. Do not have throw rugs and other things on the floor that can make you trip. What can I do with my stairs? Do not leave any items on the stairs. Make sure that there are handrails on both sides of the stairs and use them. Fix handrails that are broken or loose. Make sure that handrails are as long as the stairways. Check any carpeting to make sure that it is firmly attached to the stairs. Fix any carpet that is loose or worn. Avoid having throw rugs at the top or bottom of the stairs. If you do have throw rugs,  attach them to the floor with carpet tape. Make sure that you have a light switch at the top of the stairs and the bottom of the stairs. If you do not have them, ask someone to add them for you. What else can I do to help prevent falls? Wear shoes that: Do not have high heels. Have rubber bottoms. Are comfortable and fit you well. Are closed at the toe. Do not wear sandals. If you use a stepladder: Make sure that it is fully opened. Do not climb a closed stepladder. Make sure that both sides of the stepladder are locked into place. Ask someone to hold it for you, if possible. Clearly mark and make sure that you can see: Any grab bars or handrails. First and last steps. Where the edge of each step is. Use tools that help you move around (mobility aids) if they are needed. These include: Canes. Walkers. Scooters. Crutches. Turn on the lights when you go into a dark area. Replace any light bulbs as soon as they burn out. Set up your furniture so you have a clear path. Avoid moving your furniture around. If any of your floors are uneven, fix them. If there are any pets around you, be aware of where they are. Review your medicines with your doctor. Some medicines can make you feel dizzy. This can increase your chance of falling. Ask your doctor what other things that you can do to help prevent falls. This information is not intended to replace advice given to you by your health care provider. Make sure you discuss any questions you have with your health care provider. Document Released: 01/09/2009 Document Revised: 08/21/2015 Document Reviewed: 04/19/2014 Elsevier Interactive Patient Education  2017 [employer].

## 2022-05-20 ENCOUNTER — Telehealth: Payer: Self-pay | Admitting: Family Medicine

## 2022-05-20 NOTE — Telephone Encounter (Signed)
Lft pt vm to call ofc to sch US. thanks ?

## 2022-05-25 ENCOUNTER — Encounter: Payer: Self-pay | Admitting: Pulmonary Disease

## 2022-05-25 ENCOUNTER — Ambulatory Visit (INDEPENDENT_AMBULATORY_CARE_PROVIDER_SITE_OTHER): Payer: Medicare Other | Admitting: Pulmonary Disease

## 2022-05-25 VITALS — BP 138/78 | HR 87 | Ht 68.5 in | Wt 225.6 lb

## 2022-05-25 DIAGNOSIS — G4733 Obstructive sleep apnea (adult) (pediatric): Secondary | ICD-10-CM

## 2022-05-25 DIAGNOSIS — Z122 Encounter for screening for malignant neoplasm of respiratory organs: Secondary | ICD-10-CM | POA: Diagnosis not present

## 2022-05-25 NOTE — Patient Instructions (Addendum)
DME referral for CPAP supplies  I will see you in 6 months  Obtain pulmonary function test at next visit  Low-dose CT scan of the chest to make sure there is no spots in your lungs because of your longstanding history of smoking

## 2022-05-25 NOTE — Progress Notes (Signed)
Carl Contreras    OC:[id number]    1955/06/05  Primary Care Physician:Bedsole, Mervyn Gay, MD  Referring Physician: Jinny Sanders, MD [place],  [place] [address]  Chief complaint:   Patient being seen for obstructive sleep apnea  HPI:  Longstanding history of obstructive sleep apnea uses CPAP nightly  Does not recollect when he had a sleep study  Uses CPAP nightly  Wakes up feeling like he is at a good nights rest Sleeps for at least 9 to 10 hours  He is able to exercise regularly, takes daily walks for about 30 minutes Plays musical instruments without difficulty  Smokes about half a pack to a pack a day mom for many years  Usually goes to bed about midnight Wakes up feeling rested   Outpatient Encounter Medications as of 05/25/2022  Medication Sig   benztropine (COGENTIN) 0.5 MG tablet Take 0.5 mg by mouth daily.   fluPHENAZine (PROLIXIN) 5 MG tablet Take 5 mg by mouth daily.    lisinopril (ZESTRIL) 10 MG tablet TAKE 1 TABLET BY MOUTH ONCE DAILY. (NEEDOFFICE VISIT FOR REFILLS)   Multiple Vitamin (MULTIVITAMIN) tablet Take 1 tablet by mouth daily.   Omega-3 Fatty Acids (OMEGA-3 FISH OIL) 1000 MG CAPS Take by mouth. Take 2 tablet 2 times daily   simvastatin (ZOCOR) 40 MG tablet TAKE 1 TABLET BY MOUTH ONCE DAILY   zinc gluconate 50 MG tablet Take 50 mg by mouth daily.    ziprasidone (GEODON) 60 MG capsule Take 60 mg by mouth daily.   No facility-administered encounter medications on file as of 05/25/2022.    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    Sleep apnea    wears CPAP    Past Surgical History:  Procedure Laterality Date   COLONOSCOPY     FACIAL FRACTURE SURGERY     in college   TONSILLECTOMY      Family History  Problem Relation Age of Onset   Pancreatitis Father    Gallbladder disease Other    Stroke Mother    Colon cancer Neg Hx    Esophageal cancer Neg Hx    Rectal cancer Neg Hx    Stomach cancer Neg Hx     Social History   Socioeconomic History   Marital status: Single    Spouse name: Not on file   Number of children: 0   Years of education: Not on file   Highest education level: Not on file  Occupational History   Occupation: professional musician  Tobacco Use   Smoking status: Every Day    Packs/day: 1.00    Years: 40.00    Total pack years: 40.00    Types: Cigarettes    Start date: 03/29/1976   Smokeless tobacco: Former   Tobacco comments:    down to 1/2 pack/day  Vaping Use   Vaping Use: Never used  Substance and Sexual Activity   Alcohol use: Yes    Alcohol/week: 6.0 -  8.0 standard drinks of alcohol    Types: 6 - 8 Standard drinks or equivalent per week    Comment: once in a while    Drug use: No   Sexual activity: Not Currently  Other Topics Concern   Not on file  Social History Narrative   No living will, HCPOA: mother Carl Contreras      Lives alone in a one story home. Has a cat      Right hand      Highest level of edu- BA College   Social Determinants of Health   Financial Resource Strain: Medium Risk (05/18/2022)   Overall Financial Resource Strain (CARDIA)    Difficulty of Paying Living Expenses: Somewhat hard  Food Insecurity: No Food Insecurity (05/18/2022)   Hunger Vital Sign    Worried About Running Out of Food in the Last Year: Never true    Ran Out of Food in the Last Year: Never true  Transportation Needs: No Transportation Needs (05/18/2022)   PRAPARE - Hydrologist (Medical): No    Lack of Transportation (Non-Medical): No   Physical Activity: Inactive (05/18/2022)   Exercise Vital Sign    Days of Exercise per Week: 0 days    Minutes of Exercise per Session: 0 min  Stress: No Stress Concern Present (05/18/2022)   [place]    Feeling of Stress : Not at all  Social Connections: Moderately Isolated (05/18/2022)   Social Connection and Isolation Panel [NHANES]    Frequency of Communication with Friends and Family: More than three times a week    Frequency of Social Gatherings with Friends and Family: More than three times a week    Attends Religious Services: Never    Marine scientist or Organizations: Yes    Attends Music therapist: More than 4 times per year    Marital Status: Divorced  Intimate Partner Violence: Not At Risk (05/18/2022)   Humiliation, Afraid, Rape, and Kick questionnaire    Fear of Current or Ex-Partner: No    Emotionally Abused: No    Physically Abused: No    Sexually Abused: No    Review of Systems  Respiratory:  Positive for apnea. Negative for shortness of breath.   Psychiatric/Behavioral:  Positive for sleep disturbance.     Vitals:   05/25/22 1349  BP: 138/78  Pulse: 87  SpO2: 100%     Physical Exam Constitutional:      Appearance: He is obese.  HENT:     Head: Normocephalic.  Eyes:     General: No scleral icterus.    Pupils: Pupils are equal, round, and reactive to light.  Cardiovascular:     Rate and Rhythm: Normal rate and regular rhythm.     Heart sounds: No murmur heard.    No friction rub.  Pulmonary:     Effort: No respiratory distress.     Breath sounds: No stridor. No wheezing or rhonchi.  Musculoskeletal:     Cervical back: No rigidity or tenderness.  Neurological:     Mental Status: He is alert.  Psychiatric:        Mood and Affect: Mood normal.      Data Reviewed: CPAP download shows excellent compliance with 100% compliance Machine set between  5-20 Occasional leaks Residual AHI 1.9  Assessment:  Obstructive sleep apnea -Adequately treated with CPAP therapy  Active smoker -Denies significant shortness of breath or  chronic cough -Smoked heavier in the past, currently only smokes about half a pack to a pack of cigarettes a day 90   Plan/Recommendations: DME referral for CPAP supplies  Encouraged to continue using CPAP on a nightly basis  Will obtain a pulmonary function test on him at next visit  Schedule him for a low-dose CT screening for lung nodules  I will see him back in about 6 months  Encouraged to continue working on quitting smoking   Sherrilyn Rist MD [facility] 05/25/2022, 2:00 PM  CC: Jinny Sanders, MD

## 2022-05-25 NOTE — Addendum Note (Signed)
Addended by: June Leap on: 05/25/2022 02:16 PM   Modules accepted: Orders

## 2022-05-26 NOTE — Telephone Encounter (Signed)
Message left for patient to return my call.  Needs to schedule [place] with [place]

## 2022-05-27 ENCOUNTER — Encounter: Payer: Self-pay | Admitting: *Deleted

## 2022-06-03 NOTE — Telephone Encounter (Addendum)
ATC, unable to reach  Needs to call to schedule appt with [facility]  Please send a letter to the patient requesting them to call.  You can use the below letter...    Mr Abresch,     [name] placed an order for [place] AORTA MEDICARE SCREENING.  I have sent your order to [facility] department.    You may reach out to them to schedule your appointment at your convenience.     They have two locations in [place] and one in [place].    [facility])  [place] ([place])  [place] ([place])   They can be reached at [phone]     Please let me know if you have issues getting scheduled.    Thank you,  Audiological scientist

## 2022-06-03 NOTE — Telephone Encounter (Signed)
Letter mailed to patient.

## 2022-06-07 DIAGNOSIS — G4733 Obstructive sleep apnea (adult) (pediatric): Secondary | ICD-10-CM | POA: Diagnosis not present

## 2022-07-13 ENCOUNTER — Other Ambulatory Visit: Payer: Self-pay | Admitting: Family Medicine

## 2022-09-20 DIAGNOSIS — G4733 Obstructive sleep apnea (adult) (pediatric): Secondary | ICD-10-CM | POA: Diagnosis not present

## 2022-09-27 ENCOUNTER — Other Ambulatory Visit: Payer: Self-pay | Admitting: Family Medicine

## 2023-03-29 ENCOUNTER — Other Ambulatory Visit: Payer: Self-pay | Admitting: Family Medicine

## 2023-03-31 NOTE — Telephone Encounter (Signed)
 Please schedule CPE with fasting labs prior with Dr. Ermalene Searing after 05/23/2023.

## 2023-03-31 NOTE — Telephone Encounter (Signed)
Lvmtcb, no mychart set up to send message  

## 2023-04-05 ENCOUNTER — Telehealth: Payer: Self-pay | Admitting: Family Medicine

## 2023-04-05 DIAGNOSIS — Z1211 Encounter for screening for malignant neoplasm of colon: Secondary | ICD-10-CM

## 2023-04-05 NOTE — Telephone Encounter (Signed)
 Patient has seen Dr. Russella Dar in the past for his colonoscopy.

## 2023-04-05 NOTE — Telephone Encounter (Signed)
 Let pt know referral sent to Dr. Russella Dar.

## 2023-04-05 NOTE — Addendum Note (Signed)
 Addended by: Kerby Nora E on: 04/05/2023 09:10 AM   Modules accepted: Orders

## 2023-04-05 NOTE — Telephone Encounter (Signed)
 Patient is requesting a colonoscopy would like for you to send in referral

## 2023-04-05 NOTE — Telephone Encounter (Signed)
 Carl Contreras notified by telephone that referral has been placed as requested.  I advised he can call Dr. Ardell Isaacs office if he hasn't heard from them in a couple of weeks to get scheduled.

## 2023-07-15 ENCOUNTER — Other Ambulatory Visit: Payer: Self-pay | Admitting: Family Medicine

## 2023-07-18 ENCOUNTER — Telehealth: Payer: Self-pay | Admitting: *Deleted

## 2023-07-18 DIAGNOSIS — Z125 Encounter for screening for malignant neoplasm of prostate: Secondary | ICD-10-CM

## 2023-07-18 DIAGNOSIS — E7800 Pure hypercholesterolemia, unspecified: Secondary | ICD-10-CM

## 2023-07-18 NOTE — Telephone Encounter (Signed)
-----   Message from Gerry Krone sent at 07/18/2023  4:10 PM EDT ----- Regarding: Lab orders for Fri, 5.2.25 Patient is scheduled for CPX labs, please order future labs, Thanks , Anselmo Kings

## 2023-07-18 NOTE — Telephone Encounter (Signed)
 Please schedule CPE with fasting labs prior after 08/05/2023 with Dr. Cherlyn Cornet.

## 2023-07-18 NOTE — Telephone Encounter (Signed)
 Patient scheduled.

## 2023-07-18 NOTE — Telephone Encounter (Signed)
 lvm for pt to call office to schedule appt.

## 2023-07-29 ENCOUNTER — Other Ambulatory Visit (INDEPENDENT_AMBULATORY_CARE_PROVIDER_SITE_OTHER)

## 2023-07-29 DIAGNOSIS — Z125 Encounter for screening for malignant neoplasm of prostate: Secondary | ICD-10-CM

## 2023-07-29 DIAGNOSIS — E7800 Pure hypercholesterolemia, unspecified: Secondary | ICD-10-CM | POA: Diagnosis not present

## 2023-07-29 LAB — PSA, MEDICARE: PSA: 1.66 ng/mL (ref 0.10–4.00)

## 2023-07-29 LAB — COMPREHENSIVE METABOLIC PANEL WITH GFR
ALT: 16 U/L (ref 0–53)
AST: 18 U/L (ref 0–37)
Albumin: 4.4 g/dL (ref 3.5–5.2)
Alkaline Phosphatase: 86 U/L (ref 39–117)
BUN: 19 mg/dL (ref 6–23)
CO2: 29 meq/L (ref 19–32)
Calcium: 9.6 mg/dL (ref 8.4–10.5)
Chloride: 96 meq/L (ref 96–112)
Creatinine, Ser: 1.25 mg/dL (ref 0.40–1.50)
GFR: 59.78 mL/min — ABNORMAL LOW (ref >60.00)
Glucose, Bld: 82 mg/dL (ref 70–99)
Potassium: 4.4 meq/L (ref 3.5–5.1)
Sodium: 132 meq/L — ABNORMAL LOW (ref 135–145)
Total Bilirubin: 0.6 mg/dL (ref 0.2–1.2)
Total Protein: 6.7 g/dL (ref 6.0–8.3)

## 2023-07-29 LAB — LIPID PANEL
Cholesterol: 138 mg/dL (ref 0–200)
HDL: 45 mg/dL (ref >39.00)
LDL Cholesterol: 70 mg/dL (ref 0–99)
NonHDL: 92.81
Total CHOL/HDL Ratio: 3
Triglycerides: 115 mg/dL (ref 0.0–149.0)
VLDL: 23 mg/dL (ref 0.0–40.0)

## 2023-07-29 NOTE — Progress Notes (Signed)
 No critical labs need to be addressed urgently. We will discuss labs in detail at upcoming office visit.

## 2023-08-05 ENCOUNTER — Encounter: Payer: Self-pay | Admitting: Family Medicine

## 2023-08-05 ENCOUNTER — Ambulatory Visit (INDEPENDENT_AMBULATORY_CARE_PROVIDER_SITE_OTHER): Payer: Medicare Other

## 2023-08-05 ENCOUNTER — Ambulatory Visit (INDEPENDENT_AMBULATORY_CARE_PROVIDER_SITE_OTHER): Admitting: Family Medicine

## 2023-08-05 VITALS — Ht 69.0 in | Wt 225.0 lb

## 2023-08-05 VITALS — BP 124/63 | HR 68 | Temp 97.1°F | Ht 68.5 in | Wt 223.0 lb

## 2023-08-05 DIAGNOSIS — Z0000 Encounter for general adult medical examination without abnormal findings: Secondary | ICD-10-CM | POA: Diagnosis not present

## 2023-08-05 DIAGNOSIS — Z1211 Encounter for screening for malignant neoplasm of colon: Secondary | ICD-10-CM | POA: Diagnosis not present

## 2023-08-05 DIAGNOSIS — E7800 Pure hypercholesterolemia, unspecified: Secondary | ICD-10-CM

## 2023-08-05 DIAGNOSIS — I10 Essential (primary) hypertension: Secondary | ICD-10-CM

## 2023-08-05 DIAGNOSIS — Z122 Encounter for screening for malignant neoplasm of respiratory organs: Secondary | ICD-10-CM | POA: Diagnosis not present

## 2023-08-05 DIAGNOSIS — E871 Hypo-osmolality and hyponatremia: Secondary | ICD-10-CM

## 2023-08-05 DIAGNOSIS — E661 Drug-induced obesity: Secondary | ICD-10-CM

## 2023-08-05 DIAGNOSIS — Z6833 Body mass index (BMI) 33.0-33.9, adult: Secondary | ICD-10-CM

## 2023-08-05 DIAGNOSIS — E66811 Obesity, class 1: Secondary | ICD-10-CM

## 2023-08-05 DIAGNOSIS — F209 Schizophrenia, unspecified: Secondary | ICD-10-CM

## 2023-08-05 NOTE — Assessment & Plan Note (Signed)
Stable, chronic.  Continue current medication.   Simvastatin 40 mg daily

## 2023-08-05 NOTE — Patient Instructions (Signed)
 Carl Contreras , Thank you for taking time out of your busy schedule to complete your Annual Wellness Visit with me. I enjoyed our conversation and look forward to speaking with you again next year. I, as well as your care team,  appreciate your ongoing commitment to your health goals. Please review the following plan we discussed and let me know if I can assist you in the future. Your Game plan/ To Do List    Referrals: If you haven't heard from the office you've been referred to, please reach out to them at the phone provided.  Colonoscopy: [facility] [address] [place] [place],  [place]  [address] Main: [phone]  Follow up Visits: Next Medicare AWV with our clinical staff: 08/06/24 @ 1pm televisit   Have you seen your provider in the last 6 months (3 months if uncontrolled diabetes)? No Next Office Visit with your provider: 08/05/2023  Clinician Recommendations:  Aim for 30 minutes of exercise or brisk walking, 6-8 glasses of water, and 5 servings of fruits and vegetables each day.       This is a list of the screening recommended for you and due dates:  Health Maintenance  Topic Date Due   Pneumonia Vaccine (1 of 2 - PCV) Never done   Screening for Lung Cancer  Never done   Zoster (Shingles) Vaccine (1 of 2) Never done   COVID-19 Vaccine (4 - 2024-25 season) 11/28/2022   Colon Cancer Screening  02/27/2023   Medicare Annual Wellness Visit  05/19/2023   Flu Shot  10/28/2023   Hepatitis C Screening  Completed   HPV Vaccine  Aged Out   Meningitis B Vaccine  Aged Out   DTaP/Tdap/Td vaccine  Discontinued    Advanced directives: (Copy Requested) Please bring a copy of your health care power of attorney and living will to the office to be added to your chart at your convenience. You can mail to [facility] [address] [place], [place] [address] or email to [email or web address] Advance Care Planning is important because it:  [x]   Makes sure you receive the medical care that is consistent with your values, goals, and preferences [x]  It provides guidance to your family and loved ones and reduces their decisional burden about whether or not they are making the right decisions based on your wishes.  Follow the link provided in your after visit summary or read over the paperwork we have mailed to you to help you started getting your Advance Directives in place. If you need assistance in completing these, please reach out to us  so that we can help you!

## 2023-08-05 NOTE — Assessment & Plan Note (Signed)
 Stable.. likely due to psychiatric meds.

## 2023-08-05 NOTE — Assessment & Plan Note (Addendum)
 Encouraged exercise, weight loss, healthy eating habits.  Discussed stimulants not an option given HTN.  G:P1 inhibitors not covered given no D< or CVD risk equivalent.  Offered referral to bariatric  specialist.

## 2023-08-05 NOTE — Assessment & Plan Note (Signed)
Stable, chronic.  Continue current medication.   Lisinopril 10 mg daily   

## 2023-08-05 NOTE — Assessment & Plan Note (Signed)
Stable per pt on current regimen.  Followed closely by psychiatry. 

## 2023-08-05 NOTE — Progress Notes (Signed)
 Subjective:   Carl Contreras is a 68 y.o. who presents for a Medicare Wellness preventive visit.  As a reminder, Annual Wellness Visits don't include a physical exam, and some assessments may be limited, especially if this visit is performed virtually. We may recommend an in-person visit if needed.  Visit Complete: Virtual I connected with  Carl Contreras on 08/05/23 by a audio enabled telemedicine application and verified that I am speaking with the correct person using two identifiers.  Patient Location: Home  Provider Location: Office/Clinic  I discussed the limitations of evaluation and management by telemedicine. The patient expressed understanding and agreed to proceed.  Vital Signs: Because this visit was a virtual/telehealth visit, some criteria may be missing or patient reported. Any vitals not documented were not able to be obtained and vitals that have been documented are patient reported.  VideoDeclined- This patient declined Librarian, academic. Therefore the visit was completed with audio only.  Persons Participating in Visit: Patient.  AWV Questionnaire: No: Patient Medicare AWV questionnaire was not completed prior to this visit.  Cardiac Risk Factors include: advanced age (>63men, >82 women);dyslipidemia;hypertension;male gender;sedentary lifestyle;obesity (BMI >30kg/m2);smoking/ tobacco exposure     Objective:     Today's Vitals   08/05/23 1304  Weight: 225 lb (102.1 kg)  Height: 5\' 9"  (1.753 m)   Body mass index is 33.23 kg/m.     08/05/2023    1:14 PM 05/18/2022    8:19 AM 08/18/2018    2:24 PM 08/11/2018    9:13 PM 08/02/2018    9:30 AM 10/30/2015   11:08 AM 06/06/2014    8:29 AM  Advanced Directives  Does Patient Have a Medical Advance Directive? Yes No No No Yes Yes Yes  Type of Estate agent of [place];Living will    Healthcare Power of [place];Living will Healthcare Power of [place];Living will  Healthcare Power of Attorney  Copy of Healthcare Power of Attorney in Chart? No - copy requested    No - copy requested No - copy requested   Would patient like information on creating a medical advance directive?  No - Patient declined  No - Guardian declined       Current Medications (verified) Outpatient Encounter Medications as of 08/05/2023  Medication Sig   benztropine (COGENTIN) 0.5 MG tablet Take 0.5 mg by mouth daily.   fluPHENAZine (PROLIXIN) 5 MG tablet Take 5 mg by mouth daily.    lisinopril  (ZESTRIL ) 10 MG tablet Take 1 tablet (10 mg total) by mouth daily.   Multiple Vitamin (MULTIVITAMIN) tablet Take 1 tablet by mouth daily.   Omega-3 Fatty Acids (OMEGA-3 FISH OIL) 1000 MG CAPS Take by mouth. Take 2 tablet 2 times daily   simvastatin  (ZOCOR ) 40 MG tablet TAKE ONE TABLET BY MOUTH ONCE DAILY   zinc gluconate 50 MG tablet Take 50 mg by mouth daily.    ziprasidone (GEODON) 60 MG capsule Take 60 mg by mouth daily.   No facility-administered encounter medications on file as of 08/05/2023.    Allergies (verified) Chantix [varenicline] and Varenicline tartrate   History: Past Medical History:  Diagnosis Date   Allergic rhinitis    Allergy    Arthritis    Chronic kidney disease    kidney stone   Hyperlipidemia    Hypertension    Schizophrenic disorder (HCC)    paranoid schizophrenic   Seizures (HCC)    questionable one time seizure   Sleep apnea    wears CPAP  Past Surgical History:  Procedure Laterality Date   COLONOSCOPY     FACIAL FRACTURE SURGERY     in college   TONSILLECTOMY     Family History  Problem Relation Age of Onset   Pancreatitis Father    Gallbladder disease Other    Stroke Mother    Colon cancer Neg Hx    Esophageal cancer Neg Hx    Rectal cancer Neg Hx    Stomach cancer Neg Hx    Social History   Socioeconomic History   Marital status: Single    Spouse name: Not on file   Number of children: 0   Years of education: Not on file    Highest education level: Not on file  Occupational History   Occupation: professional musician  Tobacco Use   Smoking status: Every Day    Current packs/day: 1.00    Average packs/day: 1 pack/day for 47.4 years (47.4 ttl pk-yrs)    Types: Cigarettes    Start date: 03/29/1976   Smokeless tobacco: Former   Tobacco comments:    down to 1/2 pack/day  Vaping Use   Vaping status: Never Used  Substance and Sexual Activity   Alcohol use: Yes    Alcohol/week: 6.0 - 8.0 standard drinks of alcohol    Types: 6 - 8 Standard drinks or equivalent per week    Comment: once in a while    Drug use: No   Sexual activity: Not Currently  Other Topics Concern   Not on file  Social History Narrative   No living will, HCPOA: mother Aristede Buchmann      Lives alone in a one story home. Has a cat      Right hand      Highest level of edu- BA College   Social Drivers of Health   Financial Resource Strain: Low Risk  (08/05/2023)   Overall Financial Resource Strain (CARDIA)    Difficulty of Paying Living Expenses: Not hard at all  Food Insecurity: No Food Insecurity (08/05/2023)   Hunger Vital Sign    Worried About Running Out of Food in the Last Year: Never true    Ran Out of Food in the Last Year: Never true  Transportation Needs: No Transportation Needs (08/05/2023)   PRAPARE - Administrator, Civil Service (Medical): No    Lack of Transportation (Non-Medical): No  Physical Activity: Inactive (08/05/2023)   Exercise Vital Sign    Days of Exercise per Week: 0 days    Minutes of Exercise per Session: 0 min  Stress: No Stress Concern Present (08/05/2023)   [employer] of Occupational Health - Occupational Stress Questionnaire    Feeling of Stress : Not at all  Social Connections: Unknown (08/05/2023)   Social Connection and Isolation Panel [NHANES]    Frequency of Communication with Friends and Family: More than three times a week    Frequency of Social Gatherings with Friends and  Family: More than three times a week    Attends Religious Services: Never    Database administrator or Organizations: Yes    Attends Engineer, structural: More than 4 times per year    Marital Status: Not on file    Tobacco Counseling Ready to quit: Not Answered Counseling given: Not Answered Tobacco comments: down to 1/2 pack/day    Clinical Intake:  Pre-visit preparation completed: Yes  Pain : No/denies pain     BMI - recorded: 33.23 Nutritional Status: BMI >  30  Obese Nutritional Risks: None Diabetes: No  No results found for: "HGBA1C"   How often do you need to have someone help you when you read instructions, pamphlets, or other written materials from your doctor or pharmacy?: 1 - Never  Interpreter Needed?: No  Comments: lives alone Information entered by :: B.Oretha Weismann,LPN   Activities of Daily Living     08/05/2023    1:14 PM  In your present state of health, do you have any difficulty performing the following activities:  Hearing? 0  Vision? 0  Difficulty concentrating or making decisions? 1  Walking or climbing stairs? 0  Dressing or bathing? 0  Doing errands, shopping? 0  Preparing Food and eating ? N  Using the Toilet? N  In the past six months, have you accidently leaked urine? N  Do you have problems with loss of bowel control? N  Managing your Medications? N  Managing your Finances? N  Housekeeping or managing your Housekeeping? N    Patient Care Team: Judithann Novas, MD as PCP - General  Indicate any recent Medical Services you may have received from other than [facility] providers in the past year (date may be approximate).     Assessment:    This is a routine wellness examination for Carl Contreras.  Hearing/Vision screen Hearing Screening - Comments:: Pt says his hearing is good Vision Screening - Comments:: Pt says his vision is good w/glasses   Goals Addressed             This Visit's Progress    Patient Stated   On track     08/05/23-, I will continue to take medications as prescribed.      COMPLETED: Patient Stated   On track    Be 50% better.       Depression Screen     08/05/2023    1:11 PM 05/18/2022    8:13 AM 01/10/2020   11:28 AM 08/02/2018    9:20 AM 01/27/2017    2:07 PM 10/30/2015   10:16 AM  PHQ 2/9 Scores  PHQ - 2 Score 0 0 0 0 0 0  PHQ- 9 Score   0 0      Fall Risk     08/05/2023    1:08 PM 05/18/2022    8:07 AM 05/15/2021   11:52 AM 01/10/2020   11:22 AM 04/18/2019   10:54 AM  Fall Risk   Falls in the past year? 0 1 0 0 0  Number falls in past yr: 0 1  0 0  Comment  Misses chair when sitting     Injury with Fall? 0 0  0 0  Risk for fall due to : No Fall Risks      Follow up Education provided;Falls prevention discussed Falls prevention discussed;Falls evaluation completed   Falls evaluation completed    MEDICARE RISK AT HOME:  Medicare Risk at Home Any stairs in or around the home?: Yes If so, are there any without handrails?: Yes Home free of loose throw rugs in walkways, pet beds, electrical cords, etc?: Yes Adequate lighting in your home to reduce risk of falls?: Yes Life alert?: No Use of a cane, walker or w/c?: No Grab bars in the bathroom?: No Shower chair or bench in shower?: No Elevated toilet seat or a handicapped toilet?: No  TIMED UP AND GO:  Was the test performed?  No  Cognitive Function: 6CIT completed    08/02/2018    9:21 AM  MMSE - Mini Mental State Exam  Orientation to time 5  Orientation to Place 5  Registration 3  Attention/ Calculation 0  Recall 3  Language- name 2 objects 0  Language- repeat 1  Language- follow 3 step command 0  Language- read & follow direction 0  Write a sentence 0  Copy design 0  Total score 17        08/05/2023    1:15 PM 05/18/2022    8:30 AM  6CIT Screen  What Year? 0 points 0 points  What month? 0 points 0 points  What time? 0 points 0 points  Count back from 20 0 points 0 points  Months in reverse 0 points   Repeat  phrase 0 points 4 points  Total Score 0 points     Immunizations Immunization History  Administered Date(s) Administered   PFIZER(Purple Top)SARS-COV-2 Vaccination 06/08/2019, 07/04/2019, 01/03/2020   Td 03/29/1998, 06/17/2009    Screening Tests Health Maintenance  Topic Date Due   Pneumonia Vaccine 86+ Years old (1 of 2 - PCV) Never done   Lung Cancer Screening  Never done   Zoster Vaccines- Shingrix (1 of 2) Never done   COVID-19 Vaccine (4 - 2024-25 season) 11/28/2022   Colonoscopy  02/27/2023   Medicare Annual Wellness (AWV)  05/19/2023   INFLUENZA VACCINE  10/28/2023   Hepatitis C Screening  Completed   HPV VACCINES  Aged Out   Meningococcal B Vaccine  Aged Out   DTaP/Tdap/Td  Discontinued    Health Maintenance  Health Maintenance Due  Topic Date Due   Pneumonia Vaccine 51+ Years old (1 of 2 - PCV) Never done   Lung Cancer Screening  Never done   Zoster Vaccines- Shingrix (1 of 2) Never done   COVID-19 Vaccine (4 - 2024-25 season) 11/28/2022   Colonoscopy  02/27/2023   Medicare Annual Wellness (AWV)  05/19/2023   Health Maintenance Items Addressed: Referral sent to GI for colonoscopy, Lung Cancer Screening ordered  Additional Screening:  Vision Screening: Recommended annual ophthalmology exams for early detection of glaucoma and other disorders of the eye.  Dental Screening: Recommended annual dental exams for proper oral hygiene  Community Resource Referral / Chronic Care Management: CRR required this visit?  No   CCM required this visit?  No   Plan:    I have personally reviewed and noted the following in the patient's chart:   Medical and social history Use of alcohol, tobacco or illicit drugs  Current medications and supplements including opioid prescriptions. Patient is not currently taking opioid prescriptions. Functional ability and status Nutritional status Physical activity Advanced directives List of other physicians Hospitalizations,  surgeries, and ER visits in previous 12 months Vitals Screenings to include cognitive, depression, and falls Referrals and appointments  In addition, I have reviewed and discussed with patient certain preventive protocols, quality metrics, and best practice recommendations. A written personalized care plan for preventive services as well as general preventive health recommendations were provided to patient.   Nerissa Bannister, LPN   04/03/1094   After Visit Summary: (Declined) Due to this being a telephonic visit, with patients personalized plan was offered to patient but patient Declined AVS at this time   Notes: Nothing significant to report at this time.

## 2023-08-05 NOTE — Progress Notes (Signed)
 Patient ID: Carl Contreras, male    DOB: 18-May-1955, 68 y.o.   MRN: [id number]  This visit was conducted in person.  BP 124/63   Pulse 68   Temp (!) 97.1 F (36.2 C) (Temporal)   Ht 5' 8.5" (1.74 m)   Wt 223 lb (101.2 kg)   SpO2 97%   BMI 33.41 kg/m      CC: Chief Complaint  Patient presents with   Annual Exam    MWV today with Cornelius Dill    Subjective:   HPI: Carl Contreras is a 68 y.o. male presenting on 08/05/2023 for Annual Exam (MWV today with Cornelius Dill)  The patient presents for complete physical and review of chronic health problems. He/She also has the following acute concerns today: none  The patient saw a LPN or RN for medicare wellness visit. Today  Prevention and wellness was reviewed in detail. Note reviewed and important notes copied below.  Flowsheet Row Clinical Support from 08/05/2023 in [facility]  PHQ-2 Total Score 0       Hypertension:   Good control on lisinopril  10 mg daily.. BP Readings from Last 3 Encounters:  08/05/23 124/63  05/25/22 138/78  05/18/22 130/70  Using medication without problems or lightheadedness:  none Chest pain with exertion: none Edema:none Short of breath:none Average home BPs: not checking Other issues:  Elevated Cholesterol: On simvastatin  40 mg daily.. LDL at goal Lab Results  Component Value Date   CHOL 138 07/29/2023   HDL 45.00 07/29/2023   LDLCALC 70 07/29/2023   LDLDIRECT 72.7 06/11/2009   TRIG 115.0 07/29/2023   CHOLHDL 3 07/29/2023  Using medications without problems: none Muscle aches:  none Diet compliance:heart healthy diet Exercise: walking  30 min daily Other complaints:  Wt Readings from Last 3 Encounters:  08/05/23 223 lb (101.2 kg)  08/05/23 225 lb (102.1 kg)  05/25/22 225 lb 9.6 oz (102.3 kg)     Schizophrenia followed by psych.      Relevant past medical, surgical, family and social history reviewed and updated as indicated. Interim medical  history since our last visit reviewed. Allergies and medications reviewed and updated. Outpatient Medications Prior to Visit  Medication Sig Dispense Refill   benztropine (COGENTIN) 0.5 MG tablet Take 0.5 mg by mouth daily.     fluPHENAZine (PROLIXIN) 5 MG tablet Take 5 mg by mouth daily.      lisinopril  (ZESTRIL ) 10 MG tablet Take 1 tablet (10 mg total) by mouth daily. 90 tablet 0   Multiple Vitamin (MULTIVITAMIN) tablet Take 1 tablet by mouth daily.     Omega-3 Fatty Acids (OMEGA-3 FISH OIL) 1000 MG CAPS Take by mouth. Take 2 tablet 2 times daily     simvastatin  (ZOCOR ) 40 MG tablet TAKE ONE TABLET BY MOUTH ONCE DAILY 90 tablet 0   zinc gluconate 50 MG tablet Take 50 mg by mouth daily.      ziprasidone (GEODON) 60 MG capsule Take 60 mg by mouth daily.     No facility-administered medications prior to visit.     Per HPI unless specifically indicated in ROS section below Review of Systems  Constitutional:  Negative for fatigue and fever.  HENT:  Negative for ear pain.   Eyes:  Negative for pain.  Respiratory:  Negative for cough and shortness of breath.   Cardiovascular:  Negative for chest pain, palpitations and leg swelling.  Gastrointestinal:  Negative for abdominal pain.  Genitourinary:  Negative for  dysuria.  Musculoskeletal:  Negative for arthralgias.  Neurological:  Negative for syncope, light-headedness and headaches.  Psychiatric/Behavioral:  Negative for dysphoric mood.    Objective:  BP 124/63   Pulse 68   Temp (!) 97.1 F (36.2 C) (Temporal)   Ht 5' 8.5" (1.74 m)   Wt 223 lb (101.2 kg)   SpO2 97%   BMI 33.41 kg/m   Wt Readings from Last 3 Encounters:  08/05/23 223 lb (101.2 kg)  08/05/23 225 lb (102.1 kg)  05/25/22 225 lb 9.6 oz (102.3 kg)      Physical Exam Constitutional:      General: He is not in acute distress.    Appearance: Normal appearance. He is well-developed. He is obese. He is not ill-appearing or toxic-appearing.  HENT:     Head:  Normocephalic and atraumatic.     Right Ear: Hearing, tympanic membrane, ear canal and external ear normal.     Left Ear: Hearing, tympanic membrane, ear canal and external ear normal.     Nose: Nose normal.     Mouth/Throat:     Pharynx: Uvula midline.  Eyes:     General: Lids are normal. Lids are everted, no foreign bodies appreciated.     Conjunctiva/sclera: Conjunctivae normal.     Pupils: Pupils are equal, round, and reactive to light.  Neck:     Thyroid: No thyroid mass or thyromegaly.     Vascular: No carotid bruit.     Trachea: Trachea and phonation normal.  Cardiovascular:     Rate and Rhythm: Normal rate and regular rhythm.     Pulses: Normal pulses.     Heart sounds: S1 normal and S2 normal. No murmur heard.    No gallop.  Pulmonary:     Breath sounds: Normal breath sounds. No wheezing, rhonchi or rales.  Abdominal:     General: Bowel sounds are normal.     Palpations: Abdomen is soft.     Tenderness: There is no abdominal tenderness. There is no guarding or rebound.     Hernia: No hernia is present.  Musculoskeletal:     Cervical back: Normal range of motion and neck supple.  Lymphadenopathy:     Cervical: No cervical adenopathy.  Skin:    General: Skin is warm and dry.     Findings: No rash.  Neurological:     Mental Status: He is alert.     Cranial Nerves: No cranial nerve deficit.     Sensory: No sensory deficit.     Gait: Gait normal.     Deep Tendon Reflexes: Reflexes are normal and symmetric.  Psychiatric:        Speech: Speech normal.        Behavior: Behavior normal.        Judgment: Judgment normal.       Results for orders placed or performed in visit on 07/29/23  Lipid panel   Collection Time: 07/29/23  9:01 AM  Result Value Ref Range   Cholesterol 138 0 - 200 mg/dL   Triglycerides 161.0 0.0 - 149.0 mg/dL   HDL 96.04 >54.09 mg/dL   VLDL 81.1 0.0 - 91.4 mg/dL   LDL Cholesterol 70 0 - 99 mg/dL   Total CHOL/HDL Ratio 3    NonHDL 92.81    Comprehensive metabolic panel   Collection Time: 07/29/23  9:01 AM  Result Value Ref Range   Sodium 132 (L) 135 - 145 mEq/L   Potassium 4.4 3.5 - 5.1 mEq/L  Chloride 96 96 - 112 mEq/L   CO2 29 19 - 32 mEq/L   Glucose, Bld 82 70 - 99 mg/dL   BUN 19 6 - 23 mg/dL   Creatinine, Ser 4.09 0.40 - 1.50 mg/dL   Total Bilirubin 0.6 0.2 - 1.2 mg/dL   Alkaline Phosphatase 86 39 - 117 U/L   AST 18 0 - 37 U/L   ALT 16 0 - 53 U/L   Total Protein 6.7 6.0 - 8.3 g/dL   Albumin 4.4 3.5 - 5.2 g/dL   GFR 81.19 (L) >14.78 mL/min   Calcium 9.6 8.4 - 10.5 mg/dL  PSA, Medicare   Collection Time: 07/29/23  9:01 AM  Result Value Ref Range   PSA 1.66 0.10 - 4.00 ng/ml    This visit occurred during the SARS-CoV-2 public health emergency.  Safety protocols were in place, including screening questions prior to the visit, additional usage of staff PPE, and extensive cleaning of exam room while observing appropriate contact time as indicated for disinfecting solutions.   COVID 19 screen:  No recent travel or known exposure to COVID19 The patient denies respiratory symptoms of COVID 19 at this time. The importance of social distancing was discussed today.   Assessment and Plan The patient's preventative maintenance and recommended screening tests for an annual wellness exam were reviewed in full today. Brought up to date unless services declined.  Counselled on the importance of diet, exercise, and its role in overall health and mortality. The patient's FH and SH was reviewed, including their home life, tobacco status, and drug and alcohol status.   Vaccines: refused flu,Td, shingrix S/P COVID19 x  3 per pt. Now due for PNA vaccine.. pt refuses Prostate Cancer Screen:  Lab Results  Component Value Date   PSA 1.66 07/29/2023   PSA 1.51 05/11/2022   PSA 0.86 05/15/2021  Colon Cancer Screen:  02/2020 Dr. Sandrea Cruel, repeat in 3 years.Aaron Aas OVERDUE      Smoking Status: daily smoker.. pre-contemplative.. has  decreased to 1/2 pack per day. ETOH/ drug use: 2 drinks once a week or less/none  Hep C:  negative  HIV screen:  refused          Lung cancer CT program... discussed  but pt refuses.  AAA screening recommended... pt refuses.   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  Discussed stimulants not an option given HTN.  G:P1 inhibitors not covered given no D< or CVD risk equivalent.  Offered referral to bariatric  specialist.      Relevant Orders   Amb Ref to Medical Weight Management   Essential hypertension, benign   Stable, chronic.  Continue current medication.   Lisinopril  10 mg daily      Hyponatremia    Stable.. likely due to psychiatric meds.      Pure hypercholesterolemia   Stable, chronic.  Continue current medication.  Simvastatin  40 mg daily      Schizophrenia (HCC)   Stable per pt on current regimen.  Followed closely by psychiatry.      Other Visit Diagnoses       Routine general medical examination at a health care facility    -  Primary          Herby Lolling, MD

## 2023-09-29 ENCOUNTER — Ambulatory Visit (AMBULATORY_SURGERY_CENTER)

## 2023-09-29 VITALS — Ht 69.0 in | Wt 222.4 lb

## 2023-09-29 DIAGNOSIS — Z860100 Personal history of colon polyps, unspecified: Secondary | ICD-10-CM

## 2023-09-29 MED ORDER — PEG 3350-KCL-NA BICARB-NACL 420 G PO SOLR: 4000.0000 mL | Freq: Once | ORAL | 0 refills | Status: AC

## 2023-09-29 NOTE — Progress Notes (Signed)
 No egg or soy allergy known to patient  No issues known to pt with past sedation with any surgeries or procedures Patient denies ever being told they had issues or difficulty with intubation  No FH of Malignant Hyperthermia Pt is not on diet pills Pt is not on  home 02  CPAP  Pt is not on blood thinners  Pt denies issues with constipation  No A fib or A flutter Have any cardiac testing pending-- no  LOA: independent  Prep: 2 day Golytely   Patient's chart reviewed by Carl Contreras CNRA prior to previsit and patient appropriate for the [facility].  Previsit completed and red dot placed by patient's name on their procedure day (on provider's schedule).     PV completed with patient. Prep instructions given at [facility] apt

## 2023-10-24 ENCOUNTER — Encounter: Admitting: Gastroenterology

## 2023-10-25 ENCOUNTER — Other Ambulatory Visit: Payer: Self-pay | Admitting: Family Medicine

## 2023-10-25 NOTE — Procedures (Signed)
Mask fit

## 2023-10-26 ENCOUNTER — Ambulatory Visit: Admitting: Gastroenterology

## 2023-10-26 ENCOUNTER — Encounter: Payer: Self-pay | Admitting: Gastroenterology

## 2023-10-26 VITALS — BP 148/89 | HR 61 | Temp 98.2°F | Resp 17 | Ht 69.0 in | Wt 222.4 lb

## 2023-10-26 DIAGNOSIS — D120 Benign neoplasm of cecum: Secondary | ICD-10-CM | POA: Diagnosis not present

## 2023-10-26 DIAGNOSIS — K6389 Other specified diseases of intestine: Secondary | ICD-10-CM | POA: Diagnosis not present

## 2023-10-26 DIAGNOSIS — Z1211 Encounter for screening for malignant neoplasm of colon: Secondary | ICD-10-CM | POA: Diagnosis not present

## 2023-10-26 DIAGNOSIS — Z860101 Personal history of adenomatous and serrated colon polyps: Secondary | ICD-10-CM

## 2023-10-26 DIAGNOSIS — D123 Benign neoplasm of transverse colon: Secondary | ICD-10-CM

## 2023-10-26 DIAGNOSIS — Z860100 Personal history of colon polyps, unspecified: Secondary | ICD-10-CM

## 2023-10-26 DIAGNOSIS — G4730 Sleep apnea, unspecified: Secondary | ICD-10-CM | POA: Diagnosis not present

## 2023-10-26 DIAGNOSIS — I10 Essential (primary) hypertension: Secondary | ICD-10-CM | POA: Diagnosis not present

## 2023-10-26 MED ORDER — SODIUM CHLORIDE 0.9 % IV SOLN: 500.0000 mL | INTRAVENOUS | Status: DC

## 2023-10-26 NOTE — Patient Instructions (Signed)

## 2023-10-26 NOTE — Progress Notes (Signed)
 History and Physical:  This patient presents for endoscopic testing for: Encounter Diagnosis  Name Primary?   Hx of colonic polyps Yes    Surveillance colonoscopy today for Hx colon polyps. 14mm cecal TA and 14 add'l polyps throughout colon Dec 2021 - 9 SSP and 5 HP Fair prep on that exam  Patient is otherwise without complaints or active issues today.   Past Medical History: Past Medical History:  Diagnosis Date   Allergic rhinitis    Allergy    Arthritis    Chronic kidney disease    kidney stone   Hyperlipidemia    Hypertension    Schizophrenic disorder (HCC)    paranoid schizophrenic   Seizures (HCC)    questionable one time seizure   Sleep apnea    wears CPAP     Past Surgical History: Past Surgical History:  Procedure Laterality Date   COLONOSCOPY     FACIAL FRACTURE SURGERY     in college   TONSILLECTOMY      Allergies: Allergies  Allergen Reactions   Chantix [Varenicline] Anxiety and Other (See Comments)    Caused increased anxiety.   Varenicline Tartrate Nausea And Vomiting    REACTION: nausea and vomiting    Outpatient Meds: Current Outpatient Medications  Medication Sig Dispense Refill   benztropine (COGENTIN) 0.5 MG tablet Take 0.5 mg by mouth daily.     fluPHENAZine (PROLIXIN) 5 MG tablet Take 5 mg by mouth daily.      lisinopril  (ZESTRIL ) 10 MG tablet TAKE ONE TABLET (10 MG TOTAL) BY MOUTH DAILY. 90 tablet 3   Multiple Vitamin (MULTIVITAMIN) tablet Take 1 tablet by mouth daily.     Omega-3 Fatty Acids (OMEGA-3 FISH OIL) 1000 MG CAPS Take by mouth. Take 2 tablet 2 times daily     simvastatin  (ZOCOR ) 40 MG tablet TAKE ONE TABLET BY MOUTH ONCE DAILY 90 tablet 3   ziprasidone (GEODON) 60 MG capsule Take 60 mg by mouth daily.     zinc gluconate 50 MG tablet Take 50 mg by mouth daily.  (Patient not taking: Reported on 10/26/2023)     Current Facility-Administered Medications  Medication Dose Route Frequency Provider Last Rate Last Admin   0.9 %   sodium chloride  infusion  500 mL Intravenous Continuous Legrand Victory CROME III, MD          ___________________________________________________________________ Objective   Exam:  BP (!) 143/93   Pulse 66   Temp 98.2 F (36.8 C)   Ht 5' 9 (1.753 m)   Wt 222 lb 6.4 oz (100.9 kg)   SpO2 98%   BMI 32.84 kg/m   CV: regular , S1/S2 Resp: clear to auscultation bilaterally, normal RR and effort noted GI: soft, no tenderness, with active bowel sounds.   Assessment: Encounter Diagnosis  Name Primary?   Hx of colonic polyps Yes     Plan: Colonoscopy   The benefits and risks of the planned procedure(s) were described in detail with the patient or (when appropriate) their health care proxy.  Risks were outlined as including, but not limited to, bleeding, infection, perforation, adverse medication reaction leading to cardiac or pulmonary decompensation, pancreatitis (if ERCP).  The limitation of incomplete mucosal visualization was also discussed.  No guarantees or warranties were given.  The patient is appropriate for an endoscopic procedure in the ambulatory setting.   - Victory Legrand, MD

## 2023-10-26 NOTE — Progress Notes (Signed)
 Pt sedate, gd SR's, VSS, report to RN

## 2023-10-26 NOTE — Progress Notes (Signed)
 Called to room to assist during endoscopic procedure.  Patient ID and intended procedure confirmed with present staff. Received instructions for my participation in the procedure from the performing physician.

## 2023-10-26 NOTE — Op Note (Signed)
 [facility] Patient Name: Kadar Chance Procedure Date: 10/26/2023 2:09 PM MRN: [id number] Endoscopist: Victory L. Legrand , MD, [id number] Age: 68 Referring MD:  Date of Birth: 05-Dec-1955 Gender: Male Account #: [id number] Procedure:                Colonoscopy Indications:              : Personal history of colonic polyps                           Last colonoscopy December 2021 (Dr. Aneita) -fair                            initial prep quality requiring extensive lavage                           14 mm cecal tubular adenoma and 14 additional                            polyps removed (9 SSP, 5 HP) Medicines:                Monitored Anesthesia Care Procedure:                Pre-Anesthesia Assessment:                           - Prior to the procedure, a History and Physical                            was performed, and patient medications and                            allergies were reviewed. The patient's tolerance of                            previous anesthesia was also reviewed. The risks                            and benefits of the procedure and the sedation                            options and risks were discussed with the patient.                            All questions were answered, and informed consent                            was obtained. Prior Anticoagulants: The patient has                            taken no anticoagulant or antiplatelet agents. ASA                            Grade Assessment: III - A patient with severe  systemic disease. After reviewing the risks and                            benefits, the patient was deemed in satisfactory                            condition to undergo the procedure.                           After obtaining informed consent, the colonoscope                            was passed under direct vision. Throughout the                            procedure, the patient's blood pressure, pulse, and                             oxygen saturations were monitored continuously. The                            CF [id number] #[id number] was introduced through the anus                            and advanced to the the cecum, identified by                            appendiceal orifice and ileocecal valve. The                            colonoscopy was performed without difficulty. The                            patient tolerated the procedure well. The quality                            of the bowel preparation was initially fair in the                            right colon with fibrous debris, then improved to                            good throughout with lavage. The ileocecal valve,                            appendiceal orifice, and rectum were photographed.                            The bowel preparation used was 2 day Suprep/Miralax. Scope In: 2:16:37 PM Scope Out: 2:40:34 PM Scope Withdrawal Time: 0 hours 20 minutes 46 seconds  Total Procedure Duration: 0 hours 23 minutes 57 seconds  Findings:                 The perianal and digital rectal  examinations were                            normal.                           A 14 mm polyp was found in the cecum near the AO.                            The polyp was multi-lobulated and sessile.                            (Initially obscured by some fibrous debris but                            visualized after lavage and clearance) The polyp                            was removed with a piecemeal technique using a cold                            snare. Resection and retrieval were complete.                           Repeat examination of right colon under NBI                            performed.                           A diminutive polyp was found in the transverse                            colon. The polyp was sessile. The polyp was removed                            with a cold snare. Resection and retrieval were                             complete.                           The exam was otherwise without abnormality on                            direct and retroflexion views. Complications:            No immediate complications. Estimated Blood Loss:     Estimated blood loss was minimal. Impression:               - One 14 mm polyp in the cecum, removed piecemeal                            using a cold snare. Resected and retrieved.                           -  One diminutive polyp in the transverse colon,                            removed with a cold snare. Resected and retrieved.                           - The examination was otherwise normal on direct                            and retroflexion views. Recommendation:           - Patient has a contact number available for                            emergencies. The signs and symptoms of potential                            delayed complications were discussed with the                            patient. Return to normal activities tomorrow.                            Written discharge instructions were provided to the                            patient.                           - Resume previous diet.                           - Continue present medications.                           - Await pathology results.                           - Repeat colonoscopy is recommended for                            surveillance. The colonoscopy date will be                            determined after pathology results from today's                            exam become available for review. Split dose                            GoLytely prep for future colonoscopies Fareedah Mahler L. Legrand, MD 10/26/2023 2:47:18 PM This report has been signed electronically.

## 2023-10-26 NOTE — Progress Notes (Signed)
 Pt's states no medical or surgical changes since previsit or office visit.

## 2023-10-27 ENCOUNTER — Telehealth: Payer: Self-pay

## 2023-10-27 NOTE — Telephone Encounter (Signed)
 Post procedure follow up call, no answer

## 2023-10-31 LAB — SURGICAL PATHOLOGY

## 2023-11-01 ENCOUNTER — Ambulatory Visit: Payer: Self-pay | Admitting: Gastroenterology

## 2023-11-15 ENCOUNTER — Encounter (INDEPENDENT_AMBULATORY_CARE_PROVIDER_SITE_OTHER): Payer: Self-pay | Admitting: Internal Medicine

## 2023-11-15 ENCOUNTER — Ambulatory Visit (INDEPENDENT_AMBULATORY_CARE_PROVIDER_SITE_OTHER): Payer: Self-pay | Admitting: Internal Medicine

## 2023-11-15 VITALS — BP 133/84 | HR 76 | Temp 98.6°F | Ht 68.0 in | Wt 213.0 lb

## 2023-11-15 DIAGNOSIS — E7800 Pure hypercholesterolemia, unspecified: Secondary | ICD-10-CM

## 2023-11-15 DIAGNOSIS — Z6832 Body mass index (BMI) 32.0-32.9, adult: Secondary | ICD-10-CM

## 2023-11-15 DIAGNOSIS — R944 Abnormal results of kidney function studies: Secondary | ICD-10-CM | POA: Insufficient documentation

## 2023-11-15 DIAGNOSIS — G4733 Obstructive sleep apnea (adult) (pediatric): Secondary | ICD-10-CM

## 2023-11-15 DIAGNOSIS — E65 Localized adiposity: Secondary | ICD-10-CM | POA: Diagnosis not present

## 2023-11-15 DIAGNOSIS — I10 Essential (primary) hypertension: Secondary | ICD-10-CM

## 2023-11-15 DIAGNOSIS — E66811 Obesity, class 1: Secondary | ICD-10-CM

## 2023-11-15 NOTE — Assessment & Plan Note (Signed)
 Obesity primarily due to central (visceral) fat accumulation with a BMI of 32 and a visceral fat score of 16. The condition is chronic and related to lifestyle factors such as diet and physical activity. Discussed the role of visceral fat in health problems and the importance of gradual weight loss to prevent muscle loss. Emphasized the need for lifestyle changes including increased meal frequency, protein intake, and physical activity to build muscle mass and improve metabolism. Weight loss medications are not recommended due to potential worsening of muscle loss and presence of restrictive eating. - Enroll in high-intensity weight management program - Increase meal frequency and protein intake - Engage in aerobic exercise such as walking  - Schedule metabolic testing to assess metabolism - Provide nutritional counseling

## 2023-11-15 NOTE — Assessment & Plan Note (Signed)
 Blood pressure is close to goal he is on lisinopril .  I reviewed recent electrolytes he has chronic hyponatremia may be related to psychotropic drug use.  He also has a decreased GFR which has been present for several years and may have chronic kidney disease.

## 2023-11-15 NOTE — Assessment & Plan Note (Signed)
 On CPAP with reported good compliance. Continue PAP therapy. Losing 15% or more of body weight may improve AHI.

## 2023-11-15 NOTE — Progress Notes (Signed)
 [address] [place], [place], [place] [address] Office: [phone]  /  Fax: [phone]   Initial Consultation    Carl Contreras was seen in clinic today to evaluate for obesity. He is interested in losing weight to improve overall health and reduce the risk of weight related complications. He presents today to review program treatment options, initial physical assessment, and evaluation.    Anthropometrics and Bioimpedance Analysis   Body mass index is 32.39 kg/m. Body Fat Mass : 26 % Visceral Fat Mass Rating : 16 Waist to Height Ratio: TBD  Obesity Related Diseases and Complications  Obesity Quality of Life and Psychosocial Complications: None  Cardiometabolic: Dyslipidemia or hypercholesterolemia and Hypertension  Biomechanical: Obstructive sleep apnea   Weight Related History  He was referred by: PCP  When asked what they would like to accomplish? He states: Adopt a healthier eating pattern and lifestyle, Improve energy levels and physical activity, Improve existing medical conditions, and Improve quality of life  Weight history: Weight gain gradual  Highest weight: 213  Contributing factors: family history of obesity, disruption of circadian rhythm / sleep disordered breathing, use of obesogenic medications: Psychotropic medications, chronic skipping of meals, and enticing relationships and enviroment  Prior weight loss attempts: None  Current or previous pharmacotherapy: None and Is interested in pharmacotherapy  Response to medication: Never tried medications  Current nutrition plan: None  Greatest challenge with dieting: none.  Current level of physical activity: Low levels of physical activity at present  Barriers to Exercise: no barriers   Past Medical History   Past Medical History:  Diagnosis Date   Allergic rhinitis    Allergy    Arthritis    Chronic kidney disease    kidney stone   Hyperlipidemia    Hypertension    Schizophrenic  disorder (HCC)    paranoid schizophrenic   Seizures (HCC)    questionable one time seizure   Sleep apnea    wears CPAP     Objective    BP 133/84   Pulse 76   Temp 98.6 F (37 C)   Ht 5' 8 (1.727 m)   Wt 213 lb (96.6 kg)   SpO2 99%   BMI 32.39 kg/m  He was weighed on the bioimpedance scale: Body mass index is 32.39 kg/m.    General:  Alert, oriented and cooperative. Patient is in no acute distress.  Respiratory: Normal respiratory effort, no problems with respiration noted   Gait: able to ambulate independently  Mental Status: Normal mood and affect. Normal behavior. Normal judgment and thought content.   Diagnostic Data Reviewed  BMET    Component Value Date/Time   NA 132 (L) 07/29/2023 0901   K 4.4 07/29/2023 0901   CL 96 07/29/2023 0901   CO2 29 07/29/2023 0901   GLUCOSE 82 07/29/2023 0901   BUN 19 07/29/2023 0901   CREATININE 1.25 07/29/2023 0901   CALCIUM 9.6 07/29/2023 0901   GFRNONAA 57 (L) 08/11/2018 2122   GFRAA >60 08/11/2018 2122   No results found for: HGBA1C No results found for: INSULIN CBC    Component Value Date/Time   WBC 10.4 08/11/2018 2122   RBC 4.56 08/11/2018 2122   HGB 14.6 08/11/2018 2122   HCT 40.0 08/11/2018 2122   PLT 214 08/11/2018 2122   MCV 87.7 08/11/2018 2122   MCH 32.0 08/11/2018 2122   MCHC 36.5 (H) 08/11/2018 2122   RDW 12.1 08/11/2018 2122   Iron/TIBC/Ferritin/ %Sat No results found for: IRON, TIBC, FERRITIN,  IRONPCTSAT Lipid Panel     Component Value Date/Time   CHOL 138 07/29/2023 0901   TRIG 115.0 07/29/2023 0901   HDL 45.00 07/29/2023 0901   CHOLHDL 3 07/29/2023 0901   VLDL 23.0 07/29/2023 0901   LDLCALC 70 07/29/2023 0901   LDLDIRECT 72.7 06/11/2009 0816   Hepatic Function Panel     Component Value Date/Time   PROT 6.7 07/29/2023 0901   ALBUMIN 4.4 07/29/2023 0901   AST 18 07/29/2023 0901   ALT 16 07/29/2023 0901   ALKPHOS 86 07/29/2023 0901   BILITOT 0.6 07/29/2023 0901   BILIDIR  0.1 06/18/2010 0903      Component Value Date/Time   TSH 1.99 06/11/2009 0816    Medications  Outpatient Encounter Medications as of 11/15/2023  Medication Sig   benztropine (COGENTIN) 0.5 MG tablet Take 0.5 mg by mouth daily.   fluPHENAZine (PROLIXIN) 5 MG tablet Take 5 mg by mouth daily.    lisinopril  (ZESTRIL ) 10 MG tablet TAKE ONE TABLET (10 MG TOTAL) BY MOUTH DAILY.   Multiple Vitamin (MULTIVITAMIN) tablet Take 1 tablet by mouth daily.   Omega-3 Fatty Acids (OMEGA-3 FISH OIL) 1000 MG CAPS Take by mouth. Take 2 tablet 2 times daily   simvastatin  (ZOCOR ) 40 MG tablet TAKE ONE TABLET BY MOUTH ONCE DAILY   ziprasidone (GEODON) 60 MG capsule Take 60 mg by mouth daily.   zinc gluconate 50 MG tablet Take 50 mg by mouth daily.  (Patient not taking: Reported on 10/26/2023)   No facility-administered encounter medications on file as of 11/15/2023.     Assessment and Plan   There are no diagnoses linked to this encounter. Assessment & Plan Decreased GFR Patient has had several GFR's under 60 for more than 3 months consistent with chronic kidney disease etiology to be elucidated.  Risk factors include hypertension, obesity.  No diabetes or prediabetes.  No known fatty liver disease.  No chronic use of NSAIDs.  Losing 10% of body weight may improve condition.  Patient counseled on improving hydration and avoiding nephrotoxins. Obstructive sleep apnea On CPAP with reported good compliance. Continue PAP therapy. Losing 15% or more of body weight may improve AHI.    Pure hypercholesterolemia LDL is at goal. Elevated LDL may be secondary to nutrition, genetics and spillover effect from excess adiposity. Recommended LDL goal is <70 to reduce the risk of fatty streaks and the progression to obstructive ASCVD in the future.   His 10 year risk is: The 10-year ASCVD risk score (Arnett DK, et al., 2019) is: 19.9%  Lab Results  Component Value Date   CHOL 138 07/29/2023   HDL 45.00 07/29/2023    LDLCALC 70 07/29/2023   LDLDIRECT 72.7 06/11/2009   TRIG 115.0 07/29/2023   CHOLHDL 3 07/29/2023    Patient would benefit from reducing saturated fats in his diet to less than 10% of calories.  Also engaging in aerobic physical activity. Essential hypertension, benign Blood pressure is close to goal he is on lisinopril .  I reviewed recent electrolytes he has chronic hyponatremia may be related to psychotropic drug use.  He also has a decreased GFR which has been present for several years and may have chronic kidney disease. 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 The condition is chronic and related to lifestyle factors such as diet and physical activity. Discussed the role of visceral fat in health problems and the importance of  gradual weight loss to prevent muscle loss. Emphasized the need for lifestyle changes including increased meal frequency, protein intake, and physical activity to build muscle mass and improve metabolism. Weight loss medications are not recommended due to potential worsening of muscle loss and presence of restrictive eating. - Enroll in high-intensity weight management program - Increase meal frequency and protein intake - Engage in aerobic exercise such as walking  - Schedule metabolic testing to assess metabolism - Provide nutritional counseling Class 1 obesity with serious comorbidity and body mass index (BMI) of 32.0 to 32.9 in adult, unspecified obesity type This patient has a body fat percentage of 26 with a visceral fat rating of 16 so he predominantly has central adiposity.  He chronically skips meals, has family history of obesity, has low volume of physical activity and is on obesogenic medications.  I suspect he may have a slow metabolic rate it also looks like he has been losing muscle mass likely due to inadequate protein intake.  He acknowledges drinking alcohol in the past  but has not been drinking alcohol.  We reviewed anthropometrics, biometrics, associated medical conditions and contributing factors with patient. he would benefit from a medically tailored reduced calorie nutrional plan based on his REE (resting energy expenditure), which will be determined by indirect calorimetry.  We will also assess for cardiometabolic risk and nutritional derangements via fasting labs at intake appointment.             Obesity Treatment and Action Plan:  Patient will work on garnering support from family and friends to begin weight loss journey. Will work on eliminating or reducing the presence of highly palatable, calorie dense foods in the home. Will complete provided nutritional and psychosocial assessment questionnaire before the next appointment. Will be scheduled for indirect calorimetry to determine resting energy expenditure in a fasting state.  This will allow us  to create a reduced calorie, high-protein meal plan to promote loss of fat mass while preserving muscle mass. Will think about ideas on how to incorporate physical activity into their daily routine. Will work on reducing intake of added sugars, simple sugars and processed carbs. Will avoid skipping meals which may result in increased hunger signals and overeating at certain times. Counseled on the health benefits of losing 5%-15% of total body weight. Was counseled on nutritional approaches to weight loss and benefits of reducing processed foods and consuming plant-based foods and high quality protein as part of nutritional weight management. Was counseled on pharmacotherapy and role as an adjunct in weight management.   Education and Additional resources  He was weighed on the bioimpedance scale and results were discussed and documented in the synopsis.  We discussed obesity as a progressive, chronic disease and the importance of a more detailed evaluation of all the factors contributing to the  disease.  We reviewed the basic principles in obesity management.   We discussed the importance of long term lifestyle changes which include nutrition, exercise and behavioral modification as well as the importance of customizing this to his specific health and social needs.  We reviewed the role of medical interventions including pharmacotherapy and surgical interventions.   We discussed the benefits of reaching a healthier weight to alleviate the symptoms of existing conditions and reduce the risks of the biomechanical, cardiometabolic and psychological effects of obesity.  We reviewed our program approach and philosophy, which are guided by the four pillars of obesity medicine.  We discussed how to prepare for intake appointment and the importance  of fasting and avoidance of stimulants for at least 8 hours prior to indirect calorimetry.  Omar E Cichowski appears to be in the action stage of change and reports being ready to initiate intensive lifestyle and behavioral modifications as part of their weight loss journey.  Attestation  Reviewed by clinician on day of visit: allergies, medications, problem list, medical history, surgical history, family history, social history, and previous encounter notes pertinent to obesity diagnosis.  I have spent 47 minutes in the care of the patient today including: 3 minutes before the visit reviewing and preparing the chart. 37 minutes face-to-face assessing and reviewing listed medical problems as outlined in obesity care plan, providing nutritional and behavioral counseling on topics outlined in the obesity care plan, counseling regarding anti-obesity medication as outlined in obesity care plan, independently interpreting test results and goals of care, as described in assessment and plan, and reviewing and discussing biometric information and progress 7 minutes after the visit updating chart and documentation of encounter.   Lucas Parker, MD,  ABIM, [place]

## 2023-11-15 NOTE — Assessment & Plan Note (Signed)
 LDL is at goal. Elevated LDL may be secondary to nutrition, genetics and spillover effect from excess adiposity. Recommended LDL goal is <70 to reduce the risk of fatty streaks and the progression to obstructive ASCVD in the future.   His 10 year risk is: The 10-year ASCVD risk score (Arnett DK, et al., 2019) is: 19.9%  Lab Results  Component Value Date   CHOL 138 07/29/2023   HDL 45.00 07/29/2023   LDLCALC 70 07/29/2023   LDLDIRECT 72.7 06/11/2009   TRIG 115.0 07/29/2023   CHOLHDL 3 07/29/2023    Patient would benefit from reducing saturated fats in his diet to less than 10% of calories.  Also engaging in aerobic physical activity.

## 2023-11-15 NOTE — Assessment & Plan Note (Signed)
 This patient has a body fat percentage of 26 with a visceral fat rating of 16 so he predominantly has central adiposity.  He chronically skips meals, has family history of obesity, has low volume of physical activity and is on obesogenic medications.  I suspect he may have a slow metabolic rate it also looks like he has been losing muscle mass likely due to inadequate protein intake.  He acknowledges drinking alcohol in the past but has not been drinking alcohol.  We reviewed anthropometrics, biometrics, associated medical conditions and contributing factors with patient. he would benefit from a medically tailored reduced calorie nutrional plan based on his REE (resting energy expenditure), which will be determined by indirect calorimetry.  We will also assess for cardiometabolic risk and nutritional derangements via fasting labs at intake appointment.

## 2023-11-15 NOTE — Assessment & Plan Note (Signed)
 Patient has had several GFR's under 60 for more than 3 months consistent with chronic kidney disease etiology to be elucidated.  Risk factors include hypertension, obesity.  No diabetes or prediabetes.  No known fatty liver disease.  No chronic use of NSAIDs.  Losing 10% of body weight may improve condition.  Patient counseled on improving hydration and avoiding nephrotoxins.

## 2023-11-23 ENCOUNTER — Encounter: Payer: Self-pay | Admitting: Pharmacist

## 2023-11-23 NOTE — Progress Notes (Signed)
 Pharmacy Quality Measure Review  This patient is appearing on a report for being at risk of failing the adherence measure for cholesterol (statin) and hypertension (ACEi/ARB) medications this calendar year.   Medication: simvastatin  40mg , lisinopril  10 mg Last fill date: 08/08/23 for 90 day supply  Insurance report was not up to date. No action needed at this time.  Both medications have been refilled as of 11/09/23 x90 day supply.  3 additional 90d refills remain on each prescription.  Adherence remains >80% on both measures.

## 2024-02-14 ENCOUNTER — Encounter: Payer: Self-pay | Admitting: Pharmacist

## 2024-02-14 NOTE — Progress Notes (Signed)
 Pharmacy Quality Measure Review  This patient is appearing on a report for being at risk of failing the adherence measure for cholesterol (statin) medications this calendar year.   Medication: simvastatin  40 mg Last fill date: 11/09/23 for 90 day supply  Medication not refilled as of 11/13.  Confirmed with pharmacy today, patient has refilled medication on 02/13/24 x90 ds.

## 2024-08-06 ENCOUNTER — Ambulatory Visit
